# Patient Record
Sex: Female | Born: 1945 | Race: Black or African American | Hispanic: No | Smoking: Never smoker
Health system: Southern US, Community
[De-identification: ages and names within clinical notes are randomized; demographics above are authoritative.]

## PROBLEM LIST (undated history)

## (undated) DIAGNOSIS — I10 Essential (primary) hypertension: Secondary | ICD-10-CM

## (undated) DIAGNOSIS — E119 Type 2 diabetes mellitus without complications: Secondary | ICD-10-CM

## (undated) DIAGNOSIS — E785 Hyperlipidemia, unspecified: Secondary | ICD-10-CM

## (undated) DIAGNOSIS — I2510 Atherosclerotic heart disease of native coronary artery without angina pectoris: Secondary | ICD-10-CM

## (undated) HISTORY — DX: Type 2 diabetes mellitus without complications: E11.9

## (undated) HISTORY — PX: BACK SURGERY: SHX140

## (undated) HISTORY — DX: Essential (primary) hypertension: I10

## (undated) HISTORY — PX: EYE SURGERY: SHX253

## (undated) HISTORY — DX: Atherosclerotic heart disease of native coronary artery without angina pectoris: I25.10

## (undated) HISTORY — DX: Hyperlipidemia, unspecified: E78.5

---

## 2016-05-18 ENCOUNTER — Ambulatory Visit: Payer: Self-pay | Admitting: "Endocrinology

## 2017-07-26 LAB — HEMOGLOBIN A1C: Hemoglobin A1C: 12.4

## 2017-09-19 LAB — BASIC METABOLIC PANEL
BUN: 26 — AB (ref 4–21)
Creatinine: 1.2 — AB (ref <=1.1)

## 2017-09-19 LAB — LIPID PANEL
CHOLESTEROL: 212 — AB (ref 0–200)
HDL: 65 (ref 35–70)
LDL CALC: 130
Triglycerides: 77 (ref 40–160)

## 2017-12-18 ENCOUNTER — Encounter: Payer: Self-pay | Admitting: "Endocrinology

## 2017-12-18 LAB — HEMOGLOBIN A1C: Hemoglobin A1C: 13.1

## 2018-01-23 ENCOUNTER — Ambulatory Visit: Payer: Self-pay | Admitting: "Endocrinology

## 2018-02-03 ENCOUNTER — Ambulatory Visit (INDEPENDENT_AMBULATORY_CARE_PROVIDER_SITE_OTHER): Payer: Medicare Other | Admitting: "Endocrinology

## 2018-02-03 ENCOUNTER — Encounter: Payer: Self-pay | Admitting: "Endocrinology

## 2018-02-03 VITALS — BP 143/78 | HR 66 | Ht 63.0 in | Wt 201.0 lb

## 2018-02-03 DIAGNOSIS — E66812 Obesity, class 2: Secondary | ICD-10-CM | POA: Insufficient documentation

## 2018-02-03 DIAGNOSIS — I10 Essential (primary) hypertension: Secondary | ICD-10-CM

## 2018-02-03 DIAGNOSIS — E782 Mixed hyperlipidemia: Secondary | ICD-10-CM | POA: Insufficient documentation

## 2018-02-03 DIAGNOSIS — Z794 Long term (current) use of insulin: Secondary | ICD-10-CM

## 2018-02-03 DIAGNOSIS — N183 Chronic kidney disease, stage 3 unspecified: Secondary | ICD-10-CM

## 2018-02-03 DIAGNOSIS — E1122 Type 2 diabetes mellitus with diabetic chronic kidney disease: Secondary | ICD-10-CM

## 2018-02-03 DIAGNOSIS — Z6835 Body mass index (BMI) 35.0-35.9, adult: Secondary | ICD-10-CM | POA: Diagnosis not present

## 2018-02-03 NOTE — Progress Notes (Signed)
Endocrinology Consult Note       02/03/2018, 12:53 PM   Subjective:    Patient ID: Jessica Weeks, female    DOB: 11-12-1945.  Jessica Weeks is being seen in consultation for management of currently uncontrolled symptomatic diabetes requested by  Smith Robert, MD.   Past Medical History:  Diagnosis Date  . CAD (coronary artery disease)   . Diabetes mellitus, type II (HCC)   . Hyperlipidemia   . Hypertension    Past Surgical History:  Procedure Laterality Date  . EYE SURGERY     Social History   Socioeconomic History  . Marital status: Divorced    Spouse name: Not on file  . Number of children: Not on file  . Years of education: Not on file  . Highest education level: Not on file  Occupational History  . Not on file  Social Needs  . Financial resource strain: Not on file  . Food insecurity:    Worry: Not on file    Inability: Not on file  . Transportation needs:    Medical: Not on file    Non-medical: Not on file  Tobacco Use  . Smoking status: Never Smoker  . Smokeless tobacco: Never Used  Substance and Sexual Activity  . Alcohol use: Never    Frequency: Never  . Drug use: Never  . Sexual activity: Not on file  Lifestyle  . Physical activity:    Days per week: Not on file    Minutes per session: Not on file  . Stress: Not on file  Relationships  . Social connections:    Talks on phone: Not on file    Gets together: Not on file    Attends religious service: Not on file    Active member of club or organization: Not on file    Attends meetings of clubs or organizations: Not on file    Relationship status: Not on file  Other Topics Concern  . Not on file  Social History Narrative  . Not on file   Outpatient Encounter Medications as of 02/03/2018  Medication Sig  . amitriptyline (ELAVIL) 10 MG tablet at bedtime.  Marland Kitchen atorvastatin (LIPITOR) 20 MG tablet daily.  . brimonidine  (ALPHAGAN) 0.2 % ophthalmic solution 2 (two) times daily.  . carvedilol (COREG) 25 MG tablet 2 (two) times daily with a meal.   . clopidogrel (PLAVIX) 75 MG tablet daily.  Marland Kitchen ezetimibe (ZETIA) 10 MG tablet daily.  . furosemide (LASIX) 40 MG tablet daily.  Marland Kitchen gabapentin (NEURONTIN) 100 MG capsule 200 mg 3 (three) times daily.  . GNP ASPIRIN LOW DOSE 81 MG EC tablet daily.  Marland Kitchen HUMALOG KWIKPEN 100 UNIT/ML KiwkPen Inject 5-11 Units into the skin 3 (three) times daily before meals.  . Insulin Glargine (LANTUS SOLOSTAR) 100 UNIT/ML Solostar Pen Inject 20 Units into the skin at bedtime.  Marland Kitchen NIFEdipine (PROCARDIA-XL/ADALAT CC) 30 MG 24 hr tablet 2 (two) times daily.  . [DISCONTINUED] atropine 1 % ophthalmic solution    No facility-administered encounter medications on file as of 02/03/2018.     ALLERGIES: No Known Allergies  VACCINATION STATUS:  There is  no immunization history on file for this patient.  Diabetes  She presents for her initial diabetic visit. She has type 2 diabetes mellitus. Onset time: She was diagnosed at approximate age of 6 years. Her disease course has been worsening. There are no hypoglycemic associated symptoms. Pertinent negatives for hypoglycemia include no confusion, headaches, pallor or seizures. Associated symptoms include blurred vision, fatigue, polydipsia and polyuria. Pertinent negatives for diabetes include no chest pain and no polyphagia. There are no hypoglycemic complications. Symptoms are worsening. Diabetic complications include heart disease, nephropathy, peripheral neuropathy and retinopathy. Risk factors for coronary artery disease include dyslipidemia, family history, obesity, sedentary lifestyle, post-menopausal and hypertension. Current diabetic treatment includes insulin injections. Her weight is fluctuating minimally. She is following a generally unhealthy diet. When asked about meal planning, she reported none. She has not had a previous visit with a  dietitian. She never participates in exercise. (She did not bring any meter nor logs to review today.  Her most recent A1c was 13.1% on Dec 18, 2017.) An ACE inhibitor/angiotensin II receptor blocker is not being taken. Eye exam is current.  Hyperlipidemia  This is a chronic problem. The problem is uncontrolled. Exacerbating diseases include diabetes and obesity. Pertinent negatives include no chest pain, myalgias or shortness of breath. Current antihyperlipidemic treatment includes statins. Risk factors for coronary artery disease include dyslipidemia, diabetes mellitus, family history, hypertension, obesity, a sedentary lifestyle and post-menopausal.  Hypertension  This is a chronic problem. The current episode started more than 1 year ago. The problem is uncontrolled. Associated symptoms include blurred vision. Pertinent negatives include no chest pain, headaches, palpitations or shortness of breath. Risk factors for coronary artery disease include diabetes mellitus, dyslipidemia, obesity, sedentary lifestyle and post-menopausal state. Past treatments include calcium channel blockers and beta blockers. Hypertensive end-organ damage includes retinopathy.      Review of Systems  Constitutional: Positive for fatigue. Negative for chills, fever and unexpected weight change.  HENT: Negative for trouble swallowing and voice change.   Eyes: Positive for blurred vision. Negative for visual disturbance.  Respiratory: Negative for cough, shortness of breath and wheezing.   Cardiovascular: Negative for chest pain, palpitations and leg swelling.  Gastrointestinal: Negative for diarrhea, nausea and vomiting.  Endocrine: Positive for polydipsia and polyuria. Negative for cold intolerance, heat intolerance and polyphagia.  Musculoskeletal: Negative for arthralgias and myalgias.  Skin: Negative for color change, pallor, rash and wound.  Neurological: Negative for seizures and headaches.   Psychiatric/Behavioral: Negative for confusion and suicidal ideas.    Objective:    BP (!) 143/78   Pulse 66   Ht 5\' 3"  (1.6 m)   Wt 201 lb (91.2 kg)   BMI 35.61 kg/m   Wt Readings from Last 3 Encounters:  02/03/18 201 lb (91.2 kg)     Physical Exam  Constitutional: She is oriented to person, place, and time. She appears well-developed.  HENT:  Head: Normocephalic and atraumatic.  Eyes: EOM are normal.  Neck: Normal range of motion. Neck supple. No tracheal deviation present. No thyromegaly present.  Cardiovascular: Normal rate and regular rhythm.  Pulmonary/Chest: Effort normal and breath sounds normal.  Abdominal: Soft. Bowel sounds are normal. There is no tenderness. There is no guarding.  Musculoskeletal: Normal range of motion. She exhibits no edema.  Neurological: She is alert and oriented to person, place, and time. She has normal reflexes. No cranial nerve deficit. Coordination normal.  Skin: Skin is warm and dry. No rash noted. No erythema. No pallor.  Psychiatric:  She has a normal mood and affect. Judgment normal.    Recent Results (from the past 2160 hour(s))  Hemoglobin A1c     Status: None   Collection Time: 12/18/17 12:00 AM  Result Value Ref Range   Hemoglobin A1C 13.1    Lipid Panel     Component Value Date/Time   CHOL 212 (A) 09/19/2017   TRIG 77 09/19/2017   HDL 65 09/19/2017   LDLCALC 130 09/19/2017     Assessment & Plan:   1. Type 2 diabetes mellitus with stage 3 chronic kidney disease, coronary artery disease, retinopathy with long-term current use of insulin (HCC)  - Jessica Weeks has currently uncontrolled symptomatic type 2 DM since 72 years of age,  with most recent A1c of 13.1 %. Recent labs reviewed.  -her diabetes is complicated by coronary artery disease, stage 3 renal insufficiency, retinopathy, peripheral neuropathy and Jessica Weeks remains at a high risk for more acute and chronic complications which include CAD, CVA, CKD,  retinopathy, and neuropathy. These are all discussed in detail with the patient.  - I have counseled her on diet management and weight loss, by adopting a carbohydrate restricted/protein rich diet.  - Suggestion is made for her to avoid simple carbohydrates  from her diet including Cakes, Sweet Desserts, Ice Cream, Soda (diet and regular), Sweet Tea, Candies, Chips, Cookies, Store Bought Juices, Alcohol in Excess of  1-2 drinks a day, Artificial Sweeteners, and "Sugar-free" Products. This will help patient to have stable blood glucose profile and potentially avoid unintended weight gain.  - I encouraged her to switch to  unprocessed or minimally processed complex starch and increased protein intake (animal or plant source), fruits, and vegetables.  - she is advised to stick to a routine mealtimes to eat 3 meals  a day and avoid unnecessary snacks ( to snack only to correct hypoglycemia).   - she will be scheduled with Norm Salt, RDN, CDE for individualized diabetes education.  - I have approached her with the following individualized plan to manage diabetes and patient agrees:   -Given her current glycemic burden, she will continue to require intensive treatment with basal/bolus insulin at a higher dose.  -However, her compliance is questionable.  Insulin dose adjustment will be performed slowly to avoid inadvertent hypoglycemia.  -I approached her to increase her Lantus to 20 units daily at bedtime , and adjust her Humalog to 5 units 3 times a day with meals  for pre-meal BG readings of 90-150mg /dl, plus patient specific correction dose for unexpected hyperglycemia above 150mg /dl, associated with strict monitoring of glucose 4 times a day-before meals and at bedtime. - Patient is warned not to take insulin without proper monitoring per orders. -Adjustment parameters are given for hypo and hyperglycemia in writing. -Patient is encouraged to call clinic for blood glucose levels less than 70 or  above 300 mg /dl. -She would have benefited from low-dose metformin, however she does not tolerate metformin. -Patient is not a candidate for SGLT2 inhibitors due to CKD.  - she will be considered for incretin therapy as appropriate next visit. - Patient specific target  A1c;  LDL, HDL, Triglycerides, and  Waist Circumference were discussed in detail.  2) BP/HTN: Her blood pressure is not controlled to target.  She is advised to continue current medications including nifedipine  and carvedilol.   3) Lipids/HPL:   Her most recent lipid panel showed LDL of 130 consistent with uncontrolled profile.  She is advised to continue her atorvastatin  20 mg p.o. Nightly.  4)  Weight/Diet: CDE Consult will be initiated , exercise, and detailed carbohydrates information provided.  5) Chronic Care/Health Maintenance:  -she  Is on Statin medications and  is encouraged to continue to follow up with Ophthalmology, Dentist,  Podiatrist at least yearly or according to recommendations, and advised to  stay away from smoking. I have recommended yearly flu vaccine and pneumonia vaccination at least every 5 years; moderate intensity exercise for up to 150 minutes weekly; and  sleep for at least 7 hours a day.  - I advised patient to maintain close follow up with Smith Robert, MD for primary care needs.  - Time spent with the patient: 45 minutes, of which >50% was spent in obtaining information about her symptoms, reviewing her previous labs, evaluations, and treatments, counseling her about her currently uncontrolled, complicated type 2 diabetes, hyperlipidemia, hypertension, and developing developing  plans for long term treatment based on the latest recommendations.  Jessica Weeks participated in the discussions, expressed understanding, and voiced agreement with the above plans.  All questions were answered to her satisfaction. she is encouraged to contact clinic should she have any questions or concerns prior to  her return visit.  Follow up plan: - Return in about 10 days (around 02/13/2018) for follow up with meter and logs- no labs.  Marquis Lunch, MD [facility] [address] [place], [place] [address] Phone: [phone]  Fax: [phone]    02/03/2018, 12:53 PM  This note was partially dictated with voice recognition software. Similar sounding words can be transcribed inadequately or may not  be corrected upon review.

## 2018-02-03 NOTE — Patient Instructions (Signed)

## 2018-02-17 ENCOUNTER — Encounter: Payer: Self-pay | Admitting: "Endocrinology

## 2018-02-17 ENCOUNTER — Ambulatory Visit (INDEPENDENT_AMBULATORY_CARE_PROVIDER_SITE_OTHER): Payer: Medicare Other | Admitting: "Endocrinology

## 2018-02-17 VITALS — BP 131/83 | HR 65 | Ht 63.0 in | Wt 204.0 lb

## 2018-02-17 DIAGNOSIS — E782 Mixed hyperlipidemia: Secondary | ICD-10-CM | POA: Diagnosis not present

## 2018-02-17 DIAGNOSIS — E1165 Type 2 diabetes mellitus with hyperglycemia: Secondary | ICD-10-CM | POA: Insufficient documentation

## 2018-02-17 DIAGNOSIS — I10 Essential (primary) hypertension: Secondary | ICD-10-CM

## 2018-02-17 LAB — GLUCOSE, POCT (MANUAL RESULT ENTRY): POC Glucose: 48 mg/dl — AB (ref 70–99)

## 2018-02-17 NOTE — Progress Notes (Signed)
Endocrinology follow-up note       02/17/2018, 12:46 PM   Subjective:    Patient ID: Jessica Weeks, female    DOB: [birth year].  Jessica Weeks is being seen in follow-up for management of currently uncontrolled symptomatic diabetes requested by  Smith Robert, MD.   Past Medical History:  Diagnosis Date  . CAD (coronary artery disease)   . Diabetes mellitus, type II (HCC)   . Hyperlipidemia   . Hypertension    Past Surgical History:  Procedure Laterality Date  . EYE SURGERY     Social History   Socioeconomic History  . Marital status: Divorced    Spouse name: Not on file  . Number of children: Not on file  . Years of education: Not on file  . Highest education level: Not on file  Occupational History  . Not on file  Social Needs  . Financial resource strain: Not on file  . Food insecurity:    Worry: Not on file    Inability: Not on file  . Transportation needs:    Medical: Not on file    Non-medical: Not on file  Tobacco Use  . Smoking status: Never Smoker  . Smokeless tobacco: Never Used  Substance and Sexual Activity  . Alcohol use: Never    Frequency: Never  . Drug use: Never  . Sexual activity: Not on file  Lifestyle  . Physical activity:    Days per week: Not on file    Minutes per session: Not on file  . Stress: Not on file  Relationships  . Social connections:    Talks on phone: Not on file    Gets together: Not on file    Attends religious service: Not on file    Active member of club or organization: Not on file    Attends meetings of clubs or organizations: Not on file    Relationship status: Not on file  Other Topics Concern  . Not on file  Social History Narrative  . Not on file   Outpatient Encounter Medications as of 02/17/2018  Medication Sig  . amitriptyline (ELAVIL) 10 MG tablet at bedtime.  Marland Kitchen atorvastatin (LIPITOR) 20 MG tablet daily.  . brimonidine  (ALPHAGAN) 0.2 % ophthalmic solution 2 (two) times daily.  . carvedilol (COREG) 25 MG tablet 2 (two) times daily with a meal.   . clopidogrel (PLAVIX) 75 MG tablet daily.  Marland Kitchen ezetimibe (ZETIA) 10 MG tablet daily.  . furosemide (LASIX) 40 MG tablet daily.  Marland Kitchen gabapentin (NEURONTIN) 100 MG capsule 200 mg 3 (three) times daily.  . GNP ASPIRIN LOW DOSE 81 MG EC tablet daily.  Marland Kitchen HUMALOG KWIKPEN 100 UNIT/ML KiwkPen Inject 5 Units into the skin 3 (three) times daily before meals.  . Insulin Glargine (LANTUS SOLOSTAR) 100 UNIT/ML Solostar Pen Inject 20 Units into the skin at bedtime.  Marland Kitchen NIFEdipine (PROCARDIA-XL/ADALAT CC) 30 MG 24 hr tablet 2 (two) times daily.   No facility-administered encounter medications on file as of 02/17/2018.     ALLERGIES: No Known Allergies  VACCINATION STATUS:  There is no immunization history on file for this patient.  Diabetes  She presents for her follow-up diabetic visit. She has type 2 diabetes mellitus. Onset time: She was diagnosed at approximate age of 6 years. Her disease course has been fluctuating. Hypoglycemia symptoms include confusion and headaches. Pertinent negatives for hypoglycemia include no pallor or seizures. Associated symptoms include blurred vision, fatigue, polydipsia and polyuria. Pertinent negatives for diabetes include no chest pain and no polyphagia. There are no hypoglycemic complications. Symptoms are worsening. Diabetic complications include heart disease, nephropathy, peripheral neuropathy and retinopathy. Risk factors for coronary artery disease include dyslipidemia, family history, obesity, sedentary lifestyle, post-menopausal and hypertension. Current diabetic treatment includes insulin injections. Her weight is fluctuating minimally. She is following a generally unhealthy diet. When asked about meal planning, she reported none. She has not had a previous visit with a dietitian. She never participates in exercise. Her home blood glucose trend  is fluctuating dramatically. (She documented still significantly above target blood glucose profile, with some random hypoglycemia when she misses her meals.  Her most recent A1c was 13.1% on Dec 18, 2017.) An ACE inhibitor/angiotensin II receptor blocker is not being taken. Eye exam is current.  Hyperlipidemia  This is a chronic problem. The problem is uncontrolled. Exacerbating diseases include diabetes and obesity. Pertinent negatives include no chest pain, myalgias or shortness of breath. Current antihyperlipidemic treatment includes statins. Risk factors for coronary artery disease include dyslipidemia, diabetes mellitus, family history, hypertension, obesity, a sedentary lifestyle and post-menopausal.  Hypertension  This is a chronic problem. The current episode started more than 1 year ago. The problem is uncontrolled. Associated symptoms include blurred vision and headaches. Pertinent negatives include no chest pain, palpitations or shortness of breath. Risk factors for coronary artery disease include diabetes mellitus, dyslipidemia, obesity, sedentary lifestyle and post-menopausal state. Past treatments include calcium channel blockers and beta blockers. Hypertensive end-organ damage includes retinopathy.      Review of Systems  Constitutional: Positive for fatigue. Negative for chills, fever and unexpected weight change.  HENT: Negative for trouble swallowing and voice change.   Eyes: Positive for blurred vision. Negative for visual disturbance.  Respiratory: Negative for cough, shortness of breath and wheezing.   Cardiovascular: Negative for chest pain, palpitations and leg swelling.  Gastrointestinal: Negative for diarrhea, nausea and vomiting.  Endocrine: Positive for polydipsia and polyuria. Negative for cold intolerance, heat intolerance and polyphagia.  Musculoskeletal: Negative for arthralgias and myalgias.  Skin: Negative for color change, pallor, rash and wound.  Neurological:  Positive for headaches. Negative for seizures.  Psychiatric/Behavioral: Positive for confusion. Negative for suicidal ideas.    Objective:    BP 131/83   Pulse 65   Ht 5\' 3"  (1.6 m)   Wt 204 lb (92.5 kg)   BMI 36.14 kg/m   Wt Readings from Last 3 Encounters:  02/17/18 204 lb (92.5 kg)  02/03/18 201 lb (91.2 kg)     Physical Exam  Constitutional: She is oriented to person, place, and time. She appears well-developed.  HENT:  Head: Normocephalic and atraumatic.  Eyes: EOM are normal.  Neck: Normal range of motion. Neck supple. No tracheal deviation present. No thyromegaly present.  Cardiovascular: Normal rate.  Pulmonary/Chest: Effort normal.  Abdominal: There is no tenderness. There is no guarding.  Musculoskeletal: Normal range of motion. She exhibits no edema.  Neurological: She is alert and oriented to person, place, and time. She has normal reflexes. No cranial nerve deficit. Coordination normal.  Skin: Skin is warm and dry. No rash noted. No erythema. No pallor.  Psychiatric:  She has a normal mood and affect. Judgment normal.  Patient with significant cognitive deficit.     Recent Results (from the past 2160 hour(s))  Hemoglobin A1c     Status: None   Collection Time: 12/18/17 12:00 AM  Result Value Ref Range   Hemoglobin A1C 13.1   POCT glucose (manual entry)     Status: Abnormal   Collection Time: 02/17/18 10:12 AM  Result Value Ref Range   POC Glucose 48 (A) 70 - 99 mg/dl    Comment: fasting - Pt was given Orange Juice   Lipid Panel     Component Value Date/Time   CHOL 212 (A) 09/19/2017   TRIG 77 09/19/2017   HDL 65 09/19/2017   LDLCALC 130 09/19/2017     Assessment & Plan:   1. Type 2 diabetes mellitus with stage 3 chronic kidney disease, coronary artery disease, retinopathy with long-term current use of insulin (HCC)  - Jessica Weeks has currently uncontrolled symptomatic type 2 DM since 72 years of age. -She presents with significantly  fluctuating blood glucose profile including hypoglycemia.  She made significant errors of insulin dosing, took Humalog even when she is not eating.  - Her most recent A1c was 13.1 %. Recent labs reviewed.  -her diabetes is complicated by coronary artery disease, stage 3 renal insufficiency, retinopathy, peripheral neuropathy and Jessica Weeks remains at a high risk for more acute and chronic complications which include CAD, CVA, CKD, retinopathy, and neuropathy. These are all discussed in detail with the patient.  - I have counseled her on diet management and weight loss, by adopting a carbohydrate restricted/protein rich diet.  -  Suggestion is made for her to avoid simple carbohydrates  from her diet including Cakes, Sweet Desserts / Pastries, Ice Cream, Soda (diet and regular), Sweet Tea, Candies, Chips, Cookies, Store Bought Juices, Alcohol in Excess of  1-2 drinks a day, Artificial Sweeteners, and "Sugar-free" Products. This will help patient to have stable blood glucose profile and potentially avoid unintended weight gain.   - I encouraged her to switch to  unprocessed or minimally processed complex starch and increased protein intake (animal or plant source), fruits, and vegetables.  - she is advised to stick to a routine mealtimes to eat 3 meals  a day and avoid unnecessary snacks ( to snack only to correct hypoglycemia).   - she has been scheduled with Jessica Weeks, RDN, CDE for individualized diabetes education.  - I have approached her with the following individualized plan to manage diabetes and patient agrees:   -Given her current glycemic burden, she would continue to require intensive treatment with basal/bolus insulin at a higher dose.  -However, her compliance and cognitive ability is questionable .  Insulin dose adjustment will be performed slowly to avoid inadvertent hypoglycemia.  -In fact, she is unreliable for basal/bolus insulin.  After she uses her current supplies of  Lantus and Humalog, she will be switched to premixed insulin to take twice a day. -In the meantime, I reapproached her to stay on Lantus 20 units daily at bedtime , and adjust her Humalog to 5 units 3 times a day with meals  for pre-meal BG readings of greater than or equal to 90mg /dl,  associated with strict monitoring of glucose 4 times a day-before meals and at bedtime. - Patient is warned not to take insulin without proper monitoring per orders. -Adjustment parameters are given for hypo and hyperglycemia in writing. -Patient is encouraged to call clinic for blood glucose  levels less than 70 or above 300 mg /dl. -She would have benefited from low-dose metformin, however she does not tolerate metformin. -Patient is not a candidate for SGLT2 inhibitors due to CKD.  - she will be considered for incretin therapy as appropriate next visit. - Patient specific target  A1c;  LDL, HDL, Triglycerides, and  Waist Circumference were discussed in detail.  2) BP/HTN: Her blood pressure is controlled to target.    She is advised to continue current medications including nifedipine  and carvedilol.   3) Lipids/HPL:   Her most recent lipid panel showed LDL of 130 consistent with uncontrolled profile.  She is advised to continue her atorvastatin 20 mg p.o. Nightly.  4)  Weight/Diet: CDE Consult will be initiated , exercise, and detailed carbohydrates information provided.  5) Chronic Care/Health Maintenance:  -she  Is on Statin medications and  is encouraged to continue to follow up with Ophthalmology, Dentist,  Podiatrist at least yearly or according to recommendations, and advised to  stay away from smoking. I have recommended yearly flu vaccine and pneumonia vaccination at least every 5 years; moderate intensity exercise for up to 150 minutes weekly; and  sleep for at least 7 hours a day.  - I advised patient to maintain close follow up with Smith Robert, MD for primary care needs.  - Time spent with the  patient: 25 min, of which >50% was spent in reviewing her blood glucose logs , discussing her hypo- and hyper-glycemic episodes, reviewing her current and  previous labs and insulin doses and developing a plan to avoid hypo- and hyper-glycemia. Please refer to Patient Instructions for Blood Glucose Monitoring and Insulin/Medications Dosing Guide"  in media tab for additional information. Jessica Weeks participated in the discussions, expressed understanding, and voiced agreement with the above plans.  All questions were answered to her satisfaction. she is encouraged to contact clinic should she have any questions or concerns prior to her return visit.   Follow up plan: - Return in about 1 month (around 03/17/2018) for follow up with meter and logs- no labs.  Jessica Lunch, MD [facility] [address] [place], [place] [address] Phone: [phone]  Fax: [phone]    02/17/2018, 12:46 PM  This note was partially dictated with voice recognition software. Similar sounding words can be transcribed inadequately or may not  be corrected upon review.

## 2018-02-17 NOTE — Patient Instructions (Signed)

## 2018-03-17 ENCOUNTER — Encounter: Payer: Self-pay | Admitting: Nutrition

## 2018-03-17 ENCOUNTER — Encounter: Payer: Medicare Other | Attending: Emergency Medicine | Admitting: Nutrition

## 2018-03-17 ENCOUNTER — Encounter: Payer: Self-pay | Admitting: "Endocrinology

## 2018-03-17 ENCOUNTER — Ambulatory Visit (INDEPENDENT_AMBULATORY_CARE_PROVIDER_SITE_OTHER): Payer: Medicare Other | Admitting: "Endocrinology

## 2018-03-17 VITALS — Ht 63.0 in | Wt 203.0 lb

## 2018-03-17 VITALS — BP 132/81 | HR 62 | Ht 63.0 in | Wt 203.2 lb

## 2018-03-17 DIAGNOSIS — E1165 Type 2 diabetes mellitus with hyperglycemia: Secondary | ICD-10-CM

## 2018-03-17 DIAGNOSIS — I10 Essential (primary) hypertension: Secondary | ICD-10-CM

## 2018-03-17 DIAGNOSIS — Z794 Long term (current) use of insulin: Secondary | ICD-10-CM | POA: Diagnosis not present

## 2018-03-17 DIAGNOSIS — Z6835 Body mass index (BMI) 35.0-35.9, adult: Secondary | ICD-10-CM | POA: Diagnosis not present

## 2018-03-17 DIAGNOSIS — Z713 Dietary counseling and surveillance: Secondary | ICD-10-CM | POA: Diagnosis present

## 2018-03-17 DIAGNOSIS — N183 Chronic kidney disease, stage 3 (moderate): Secondary | ICD-10-CM | POA: Diagnosis not present

## 2018-03-17 DIAGNOSIS — E782 Mixed hyperlipidemia: Secondary | ICD-10-CM

## 2018-03-17 DIAGNOSIS — E1122 Type 2 diabetes mellitus with diabetic chronic kidney disease: Secondary | ICD-10-CM | POA: Diagnosis not present

## 2018-03-17 DIAGNOSIS — E1142 Type 2 diabetes mellitus with diabetic polyneuropathy: Secondary | ICD-10-CM

## 2018-03-17 DIAGNOSIS — E669 Obesity, unspecified: Secondary | ICD-10-CM | POA: Diagnosis not present

## 2018-03-17 NOTE — Patient Instructions (Addendum)
Goals 1. Eat three meals per day 2. Follow The Plate MEthod 3. Do not skip meals 4. Take 30 units of Lantus  Before bed daily. Take 10 units of Humalog before each meal Test blood sugars 4 times per day  Cut out sweets and snacks between meals Drink only water Inject insulin in abdomen area and not arms or legs for better absorption.

## 2018-03-17 NOTE — Progress Notes (Signed)
  Medical Nutrition Therapy:  Appt start time: 0930 end time:  1000.   Assessment:  Primary concerns today: DIabetes Type 2, Obesity. Walk in from Dr. Fransico Him. Will do safety questions next visit.   Lab Results  Component Value Date   HGBA1C 13.1 12/18/2017  Saw Dr. Fransico Him today. LIves by herself. Currently on 20 units of Lantus and 5 units of Humalog with meals. Reports not eating some meals and not taking insulin. Craves sweets. BS log brought in shows FBS 176-321 in am. Before lunch 200-300's before dinner 200-300's and bedtime 100-200's. Eats usually 3 meals per day but sometimes skips a meal. Testing blood sugar 4 times per day. Gets her lunch and dinner from meals on wheels. Admits to eating sweets at home. Doesn't drink much water. Not able to exercise outside the home. She doesn't cook much anymore  Willing to work on improving her diet to improve her blood sugars..  CMP Latest Ref Rng & Units 09/19/2017  BUN 4 - 21 26(A)  Creatinine 0.5 - 1.1 1.2(A)   Lipid Panel     Component Value Date/Time   CHOL 212 (A) 09/19/2017   TRIG 77 09/19/2017   HDL 65 09/19/2017   LDLCALC 130 09/19/2017       Preferred Learning Style:   Auditory  Visual  Hands on  No preference indicated   Learning Readiness:   Contemplating   MEDICATIONS: none   DIETARY INTAKE:   B0  Eggs and oatmeal usually L) Meals on Wheels D) Meals on wheels misc sweets and snacks throughout the day.  Usual physical activity: ADL  Estimated energy needs: 1500  calories 170  g carbohydrates 112 g protein 42 g fat  Progress Towards Goal(s):  In progress.   Nutritional Diagnosis:  NB-1.1 Food and nutrition-related knowledge deficit As related to Diabetes.  As evidenced by A1C 13%.    Intervention:  Nutrition and Diabetes education provided on My Plate, CHO counting, meal planning, portion sizes, timing of meals, avoiding snacks between meals unless having a low blood sugar, target ranges for A1C and  blood sugars, signs/symptoms and treatment of hyper/hypoglycemia, monitoring blood sugars, taking medications as prescribed, benefits of exercising 30 minutes per day and prevention of complications of DM. Marland Kitchen Goals 1. Eat three meals per day 2. Follow The Plate MEthod 3. Do not skip meals 4. Take 30 units of Lantus  Before bed daily. Take 10 units of Humalog before each meal Test blood sugars 4 times per day  Cut out sweets and snacks between meals Drink only water Inject insulin in abdomen area and not arms or legs for better absorption.   Teaching Method Utilized:  Visual Auditory Hands on  Handouts given during visit include:  The Plate Method   Meal Plan Card    Barriers to learning/adherence to lifestyle change: age  Demonstrated degree of understanding via:  Teach Back   Monitoring/Evaluation:  Dietary intake, exercise, meal planning, and body weight in 1 month(s).

## 2018-03-17 NOTE — Patient Instructions (Signed)

## 2018-03-17 NOTE — Progress Notes (Signed)
Endocrinology follow-up note       03/17/2018, 1:06 PM   Subjective:    Patient ID: Jessica Weeks, female    DOB: 02-Oct-1945.  Jessica Weeks is being seen in follow-up for management of currently uncontrolled symptomatic diabetes requested by  Smith Robert, MD.   Past Medical History:  Diagnosis Date  . CAD (coronary artery disease)   . Diabetes mellitus, type II (HCC)   . Hyperlipidemia   . Hypertension    Past Surgical History:  Procedure Laterality Date  . EYE SURGERY     Social History   Socioeconomic History  . Marital status: Divorced    Spouse name: Not on file  . Number of children: Not on file  . Years of education: Not on file  . Highest education level: Not on file  Occupational History  . Not on file  Social Needs  . Financial resource strain: Not on file  . Food insecurity:    Worry: Not on file    Inability: Not on file  . Transportation needs:    Medical: Not on file    Non-medical: Not on file  Tobacco Use  . Smoking status: Never Smoker  . Smokeless tobacco: Never Used  Substance and Sexual Activity  . Alcohol use: Never    Frequency: Never  . Drug use: Never  . Sexual activity: Not on file  Lifestyle  . Physical activity:    Days per week: Not on file    Minutes per session: Not on file  . Stress: Not on file  Relationships  . Social connections:    Talks on phone: Not on file    Gets together: Not on file    Attends religious service: Not on file    Active member of club or organization: Not on file    Attends meetings of clubs or organizations: Not on file    Relationship status: Not on file  Other Topics Concern  . Not on file  Social History Narrative  . Not on file   Outpatient Encounter Medications as of 03/17/2018  Medication Sig  . amitriptyline (ELAVIL) 10 MG tablet at bedtime.  Marland Kitchen atorvastatin (LIPITOR) 20 MG tablet daily.  . brimonidine  (ALPHAGAN) 0.2 % ophthalmic solution 2 (two) times daily.  . carvedilol (COREG) 25 MG tablet 2 (two) times daily with a meal.   . clopidogrel (PLAVIX) 75 MG tablet daily.  Marland Kitchen ezetimibe (ZETIA) 10 MG tablet daily.  Marland Kitchen gabapentin (NEURONTIN) 100 MG capsule 200 mg 3 (three) times daily.  . GNP ASPIRIN LOW DOSE 81 MG EC tablet daily.  Marland Kitchen HUMALOG KWIKPEN 100 UNIT/ML KiwkPen Inject 10 Units into the skin 3 (three) times daily before meals.  Marland Kitchen NIFEdipine (PROCARDIA-XL/ADALAT CC) 30 MG 24 hr tablet 2 (two) times daily.  . furosemide (LASIX) 40 MG tablet daily.  . Insulin Glargine (LANTUS SOLOSTAR) 100 UNIT/ML Solostar Pen Inject 30 Units into the skin at bedtime.   No facility-administered encounter medications on file as of 03/17/2018.     ALLERGIES: No Known Allergies  VACCINATION STATUS:  There is no immunization history on file for this patient.  Diabetes  She presents for her follow-up diabetic visit. She has type 2 diabetes mellitus. Onset time: She was diagnosed at approximate age of 6 years. Her disease course has been fluctuating. Hypoglycemia symptoms include confusion and headaches. Pertinent negatives for hypoglycemia include no pallor or seizures. Associated symptoms include blurred vision, fatigue, polydipsia and polyuria. Pertinent negatives for diabetes include no chest pain and no polyphagia. There are no hypoglycemic complications. Symptoms are worsening. Diabetic complications include heart disease, nephropathy, peripheral neuropathy and retinopathy. Weeks factors for coronary artery disease include dyslipidemia, family history, obesity, sedentary lifestyle, post-menopausal and hypertension. Current diabetic treatment includes insulin injections. Her weight is fluctuating minimally. She is following a generally unhealthy diet. When asked about meal planning, she reported none. She has not had a previous visit with a dietitian. She never participates in exercise. Her home blood glucose  trend is increasing steadily. Her breakfast blood glucose range is generally >200 mg/dl. Her lunch blood glucose range is generally >200 mg/dl. Her dinner blood glucose range is generally >200 mg/dl. Her bedtime blood glucose range is generally >200 mg/dl. Her overall blood glucose range is >200 mg/dl. (She documented still significantly above target blood glucose profile, with some random hypoglycemia when she misses her meals.  Her most recent A1c was 13.1% on Dec 18, 2017.) An ACE inhibitor/angiotensin II receptor blocker is not being taken. Eye exam is current.  Hyperlipidemia  This is a chronic problem. The problem is uncontrolled. Exacerbating diseases include diabetes and obesity. Pertinent negatives include no chest pain, myalgias or shortness of breath. Current antihyperlipidemic treatment includes statins. Weeks factors for coronary artery disease include dyslipidemia, diabetes mellitus, family history, hypertension, obesity, a sedentary lifestyle and post-menopausal.  Hypertension  This is a chronic problem. The current episode started more than 1 year ago. The problem is uncontrolled. Associated symptoms include blurred vision and headaches. Pertinent negatives include no chest pain, palpitations or shortness of breath. Weeks factors for coronary artery disease include diabetes mellitus, dyslipidemia, obesity, sedentary lifestyle and post-menopausal state. Past treatments include calcium channel blockers and beta blockers. Hypertensive end-organ damage includes retinopathy.      Review of Systems  Constitutional: Positive for fatigue. Negative for chills, fever and unexpected weight change.  HENT: Negative for trouble swallowing and voice change.   Eyes: Positive for blurred vision. Negative for visual disturbance.  Respiratory: Negative for cough, shortness of breath and wheezing.   Cardiovascular: Negative for chest pain, palpitations and leg swelling.  Gastrointestinal: Negative for  diarrhea, nausea and vomiting.  Endocrine: Positive for polydipsia and polyuria. Negative for cold intolerance, heat intolerance and polyphagia.  Musculoskeletal: Negative for arthralgias and myalgias.  Skin: Negative for color change, pallor, rash and wound.  Neurological: Positive for headaches. Negative for seizures.  Psychiatric/Behavioral: Positive for confusion. Negative for suicidal ideas.    Objective:    BP 132/81 (BP Location: Left Arm, Patient Position: Sitting)   Pulse 62   Ht 5\' 3"  (1.6 m)   Wt 203 lb 3.2 oz (92.2 kg)   SpO2 99%   BMI 36.00 kg/m   Wt Readings from Last 3 Encounters:  03/17/18 203 lb 3.2 oz (92.2 kg)  02/17/18 204 lb (92.5 kg)  02/03/18 201 lb (91.2 kg)     Physical Exam  Constitutional: She is oriented to person, place, and time. She appears well-developed.  HENT:  Head: Normocephalic and atraumatic.  Eyes: EOM are normal.  Neck: Normal range of motion. Neck supple. No tracheal deviation present. No thyromegaly present.  Cardiovascular: Normal rate.  Pulmonary/Chest: Effort normal.  Abdominal: There is no tenderness. There is no guarding.  Musculoskeletal: Normal range of motion. She exhibits no edema.  Neurological: She is alert and oriented to person, place, and time. She has normal reflexes. No cranial nerve deficit. Coordination normal.  Skin: Skin is warm and dry. No rash noted. No erythema. No pallor.  Psychiatric: She has a normal mood and affect. Judgment normal.  Patient with significant cognitive deficit.     Recent Results (from the past 2160 hour(s))  Hemoglobin A1c     Status: None   Collection Time: 12/18/17 12:00 AM  Result Value Ref Range   Hemoglobin A1C 13.1   POCT glucose (manual entry)     Status: Abnormal   Collection Time: 02/17/18 10:12 AM  Result Value Ref Range   POC Glucose 48 (A) 70 - 99 mg/dl    Comment: fasting - Pt was given Orange Juice   Lipid Panel     Component Value Date/Time   CHOL 212 (A)  09/19/2017   TRIG 77 09/19/2017   HDL 65 09/19/2017   LDLCALC 130 09/19/2017     Assessment & Plan:   1. Type 2 diabetes mellitus with stage 3 chronic kidney disease, coronary artery disease, retinopathy with long-term current use of insulin (HCC)  - Jessica Weeks has currently uncontrolled symptomatic type 2 DM since 72 years of age. -She presents with persistent, severe hyperglycemia despite basal/bolus insulin.    - Her most recent A1c was 13.1 %. Recent labs reviewed.  -her diabetes is complicated by coronary artery disease, stage 3 renal insufficiency, retinopathy, peripheral neuropathy and Jessica Weeks for more acute and chronic complications which include CAD, CVA, CKD, retinopathy, and neuropathy. These are all discussed in detail with the patient.  - I have counseled her on diet management and weight loss, by adopting a carbohydrate restricted/protein rich diet.  -  Suggestion is made for her to avoid simple carbohydrates  from her diet including Cakes, Sweet Desserts / Pastries, Ice Cream, Soda (diet and regular), Sweet Tea, Candies, Chips, Cookies, Store Bought Juices, Alcohol in Excess of  1-2 drinks a day, Artificial Sweeteners, and "Sugar-free" Products. This will help patient to have stable blood glucose profile and potentially avoid unintended weight gain.   - I encouraged her to switch to  unprocessed or minimally processed complex starch and increased protein intake (animal or plant source), fruits, and vegetables.  - she is advised to stick to a routine mealtimes to eat 3 meals  a day and avoid unnecessary snacks ( to snack only to correct hypoglycemia).   - she has been scheduled with Norm Salt, RDN, CDE for individualized diabetes education.  - I have approached her with the following individualized plan to manage diabetes and patient agrees:   -Given her current glycemic burden, she is approached for intensive treatment with  basal/bolus insulin at a higher dose.  -However, her compliance and cognitive ability is questionable .  Insulin dose adjustment will be performed slowly to avoid inadvertent hypoglycemia.  - After she uses her current supplies of Lantus and Humalog, she will be switched to premixed insulin to take twice a day. -In the meantime, I advised her to increase her Lantus to 30 units daily at bedtime , and increase  Humalog to 10 units 3 times a day with meals  for pre-meal BG readings of greater than or equal to 90mg /dl,  associated with strict monitoring  of glucose 4 times a day-before meals and at bedtime. - Patient is warned not to take insulin without proper monitoring per orders. -Adjustment parameters are given for hypo and hyperglycemia in writing. -Patient is encouraged to call clinic for blood glucose levels less than 70 or above 300 mg /dl. -She would have benefited from low-dose metformin, however she does not tolerate metformin. -Patient is not a candidate for SGLT2 inhibitors due to CKD.  - she will be considered for incretin therapy as appropriate next visit. - Patient specific target  A1c;  LDL, HDL, Triglycerides, and  Waist Circumference were discussed in detail.  2) BP/HTN: Her blood pressure is controlled to target.    She is advised to continue current medications including nifedipine  and carvedilol.   3) Lipids/HPL:   Her most recent lipid panel showed LDL of 130 consistent with uncontrolled profile.  She is advised to continue her atorvastatin 20 mg p.o. nightly.     4)  Weight/Diet: CDE Consult is been initiated , exercise, and detailed carbohydrates information provided.  5) Chronic Care/Health Maintenance:  -she  Is on Statin medications and  is encouraged to continue to follow up with Ophthalmology, Dentist,  Podiatrist at least yearly or according to recommendations, and advised to  stay away from smoking. I have recommended yearly flu vaccine and pneumonia vaccination at  least every 5 years; moderate intensity exercise for up to 150 minutes weekly; and  sleep for at least 7 hours a day.  - I advised patient to maintain close follow up with Smith Robert, MD for primary care needs.  - Time spent with the patient: 25 min, of which >50% was spent in reviewing her blood glucose logs , discussing her hypo- and hyper-glycemic episodes, reviewing her current and  previous labs and insulin doses and developing a plan to avoid hypo- and hyper-glycemia. Please refer to Patient Instructions for Blood Glucose Monitoring and Insulin/Medications Dosing Guide"  in media tab for additional information. Jessica Weeks participated in the discussions, expressed understanding, and voiced agreement with the above plans.  All questions were answered to her satisfaction. she is encouraged to contact clinic should she have any questions or concerns prior to her return visit.   Follow up plan: - Return in about 2 weeks (around 03/31/2018) for follow up with pre-visit labs, meter, and logs.  Marquis Lunch, MD [facility] [address] [place], [place] [address] Phone: [phone]  Fax: [phone]    03/17/2018, 1:06 PM  This note was partially dictated with voice recognition software. Similar sounding words can be transcribed inadequately or may not  be corrected upon review.

## 2018-03-31 ENCOUNTER — Ambulatory Visit: Payer: Medicare Other | Admitting: Nutrition

## 2018-03-31 ENCOUNTER — Ambulatory Visit: Payer: Medicare Other | Admitting: "Endocrinology

## 2018-07-21 LAB — BASIC METABOLIC PANEL
BUN: 42 — AB (ref 4–21)
Creatinine: 1.5 — AB (ref 0.5–1.1)

## 2018-07-21 LAB — HEMOGLOBIN A1C: Hemoglobin A1C: 12.2

## 2018-08-07 ENCOUNTER — Encounter: Payer: Self-pay | Admitting: "Endocrinology

## 2018-08-07 ENCOUNTER — Emergency Department (HOSPITAL_COMMUNITY)
Admission: EM | Admit: 2018-08-07 | Discharge: 2018-08-07 | Disposition: A | Discharge status: Home / Self Care | Payer: Medicare Other | Attending: Emergency Medicine | Admitting: Emergency Medicine

## 2018-08-07 ENCOUNTER — Ambulatory Visit (INDEPENDENT_AMBULATORY_CARE_PROVIDER_SITE_OTHER): Payer: Medicare Other | Admitting: "Endocrinology

## 2018-08-07 ENCOUNTER — Encounter (HOSPITAL_COMMUNITY): Payer: Self-pay | Admitting: Emergency Medicine

## 2018-08-07 ENCOUNTER — Other Ambulatory Visit: Payer: Self-pay

## 2018-08-07 ENCOUNTER — Emergency Department (HOSPITAL_COMMUNITY): Payer: Medicare Other

## 2018-08-07 VITALS — BP 202/95 | HR 66 | Ht 63.0 in | Wt 202.0 lb

## 2018-08-07 DIAGNOSIS — Z7902 Long term (current) use of antithrombotics/antiplatelets: Secondary | ICD-10-CM | POA: Diagnosis not present

## 2018-08-07 DIAGNOSIS — E1165 Type 2 diabetes mellitus with hyperglycemia: Secondary | ICD-10-CM

## 2018-08-07 DIAGNOSIS — N390 Urinary tract infection, site not specified: Secondary | ICD-10-CM | POA: Diagnosis not present

## 2018-08-07 DIAGNOSIS — I10 Essential (primary) hypertension: Secondary | ICD-10-CM | POA: Diagnosis present

## 2018-08-07 DIAGNOSIS — Z794 Long term (current) use of insulin: Secondary | ICD-10-CM | POA: Diagnosis not present

## 2018-08-07 DIAGNOSIS — E119 Type 2 diabetes mellitus without complications: Secondary | ICD-10-CM | POA: Insufficient documentation

## 2018-08-07 DIAGNOSIS — E782 Mixed hyperlipidemia: Secondary | ICD-10-CM | POA: Diagnosis not present

## 2018-08-07 DIAGNOSIS — Z79899 Other long term (current) drug therapy: Secondary | ICD-10-CM | POA: Diagnosis not present

## 2018-08-07 DIAGNOSIS — R42 Dizziness and giddiness: Secondary | ICD-10-CM | POA: Insufficient documentation

## 2018-08-07 DIAGNOSIS — I2510 Atherosclerotic heart disease of native coronary artery without angina pectoris: Secondary | ICD-10-CM | POA: Diagnosis not present

## 2018-08-07 LAB — URINALYSIS, ROUTINE W REFLEX MICROSCOPIC
Bilirubin Urine: NEGATIVE
Glucose, UA: NEGATIVE mg/dL
Ketones, ur: NEGATIVE mg/dL
Nitrite: NEGATIVE
Protein, ur: 100 mg/dL — AB
Specific Gravity, Urine: 1.015 (ref 1.005–1.030)
pH: 5 (ref 5.0–8.0)

## 2018-08-07 LAB — CBC WITH DIFFERENTIAL/PLATELET
Abs Immature Granulocytes: 0.01 10*3/uL (ref 0.00–0.07)
Basophils Absolute: 0 10*3/uL (ref 0.0–0.1)
Basophils Relative: 1 %
Eosinophils Absolute: 0.4 10*3/uL (ref 0.0–0.5)
Eosinophils Relative: 8 %
HCT: 31.4 % — ABNORMAL LOW (ref 36.0–46.0)
Hemoglobin: 9.9 g/dL — ABNORMAL LOW (ref 12.0–15.0)
Immature Granulocytes: 0 %
Lymphocytes Relative: 35 %
Lymphs Abs: 1.7 10*3/uL (ref 0.7–4.0)
MCH: 28 pg (ref 26.0–34.0)
MCHC: 31.5 g/dL (ref 30.0–36.0)
MCV: 88.7 fL (ref 80.0–100.0)
MONOS PCT: 9 %
Monocytes Absolute: 0.4 10*3/uL (ref 0.1–1.0)
Neutro Abs: 2.3 10*3/uL (ref 1.7–7.7)
Neutrophils Relative %: 47 %
Platelets: 223 10*3/uL (ref 150–400)
RBC: 3.54 MIL/uL — ABNORMAL LOW (ref 3.87–5.11)
RDW: 13 % (ref 11.5–15.5)
WBC: 4.8 10*3/uL (ref 4.0–10.5)
nRBC: 0 % (ref 0.0–0.2)

## 2018-08-07 LAB — BASIC METABOLIC PANEL
Anion gap: 7 (ref 5–15)
BUN: 30 mg/dL — ABNORMAL HIGH (ref 8–23)
CALCIUM: 9.1 mg/dL (ref 8.9–10.3)
CO2: 24 mmol/L (ref 22–32)
Chloride: 108 mmol/L (ref 98–111)
Creatinine, Ser: 1.12 mg/dL — ABNORMAL HIGH (ref 0.44–1.00)
GFR calc Af Amer: 58 mL/min — ABNORMAL LOW (ref >60)
GFR, EST NON AFRICAN AMERICAN: 50 mL/min — AB (ref >60)
Glucose, Bld: 149 mg/dL — ABNORMAL HIGH (ref 70–99)
Potassium: 4.5 mmol/L (ref 3.5–5.1)
Sodium: 139 mmol/L (ref 135–145)

## 2018-08-07 LAB — TROPONIN I: Troponin I: <0.03 ng/mL (ref <0.03)

## 2018-08-07 MED ORDER — CEPHALEXIN 500 MG PO CAPS: 500.0000 mg | ORAL_CAPSULE | Freq: Four times a day (QID) | ORAL | 0 refills | Status: DC

## 2018-08-07 NOTE — ED Triage Notes (Signed)
Patient sent by her endocrinologist due to hypertension; recorded as 197/80 and 202/95. Patient states she did have a spell of dizziness yesterday but has no pains.

## 2018-08-07 NOTE — Discharge Instructions (Signed)
Take your usual prescriptions as previously directed.  Take your blood pressure only once per day, a few times per week, either in the morning approximately 1 hour after you take your medicine(s) or in the evening before you go to bed.  Always sit quietly for at least 15 minutes before taking your blood pressure.  Keep a diary of your blood pressures to show your doctor at your follow up office visit.  Call your regular medical doctor today to schedule a follow up appointment in the next 2 days.  Return to the Emergency Department immediately sooner if worsening.

## 2018-08-07 NOTE — Patient Instructions (Signed)

## 2018-08-07 NOTE — ED Notes (Signed)
Pt to xray

## 2018-08-07 NOTE — Progress Notes (Signed)
Endocrinology follow-up note       08/07/2018, 2:36 PM   Subjective:    Patient ID: Jessica Weeks, female    DOB: [birth year].  Jessica Weeks is being seen in follow-up for management of currently uncontrolled symptomatic type 2 diabetes, hypertension, hyperlipidemia.  PMD:  Smith Robert, MD.   Past Medical History:  Diagnosis Date  . CAD (coronary artery disease)   . Diabetes mellitus, type II (HCC)   . Hyperlipidemia   . Hypertension    Past Surgical History:  Procedure Laterality Date  . EYE SURGERY     Social History   Socioeconomic History  . Marital status: Divorced    Spouse name: Not on file  . Number of children: Not on file  . Years of education: Not on file  . Highest education level: Not on file  Occupational History  . Not on file  Social Needs  . Financial resource strain: Not on file  . Food insecurity:    Worry: Not on file    Inability: Not on file  . Transportation needs:    Medical: Not on file    Non-medical: Not on file  Tobacco Use  . Smoking status: Never Smoker  . Smokeless tobacco: Never Used  Substance and Sexual Activity  . Alcohol use: Never    Frequency: Never  . Drug use: Never  . Sexual activity: Not on file  Lifestyle  . Physical activity:    Days per week: Not on file    Minutes per session: Not on file  . Stress: Not on file  Relationships  . Social connections:    Talks on phone: Not on file    Gets together: Not on file    Attends religious service: Not on file    Active member of club or organization: Not on file    Attends meetings of clubs or organizations: Not on file    Relationship status: Not on file  Other Topics Concern  . Not on file  Social History Narrative  . Not on file   Outpatient Encounter Medications as of 08/07/2018  Medication Sig  . amitriptyline (ELAVIL) 10 MG tablet at bedtime.  Marland Kitchen atorvastatin (LIPITOR) 20 MG  tablet daily.  . brimonidine (ALPHAGAN) 0.2 % ophthalmic solution 2 (two) times daily.  . carvedilol (COREG) 25 MG tablet 2 (two) times daily with a meal.   . clopidogrel (PLAVIX) 75 MG tablet daily.  Marland Kitchen ezetimibe (ZETIA) 10 MG tablet daily.  . furosemide (LASIX) 40 MG tablet daily.  Marland Kitchen gabapentin (NEURONTIN) 100 MG capsule 200 mg 3 (three) times daily.  . GNP ASPIRIN LOW DOSE 81 MG EC tablet daily.  Marland Kitchen HUMALOG KWIKPEN 100 UNIT/ML KiwkPen Inject 10 Units into the skin 3 (three) times daily before meals.  . Insulin Glargine (LANTUS SOLOSTAR) 100 UNIT/ML Solostar Pen Inject 30 Units into the skin at bedtime.  Marland Kitchen NIFEdipine (PROCARDIA-XL/ADALAT CC) 30 MG 24 hr tablet 2 (two) times daily.   No facility-administered encounter medications on file as of 08/07/2018.     ALLERGIES: Allergies  Allergen Reactions  . Lisinopril Itching  . Novolog [Insulin Aspart] Hives  VACCINATION STATUS:  There is no immunization history on file for this patient.  Diabetes  She presents for her follow-up diabetic visit. She has type 2 diabetes mellitus. Onset time: She was diagnosed at approximate age of 6 years. Her disease course has been worsening. Hypoglycemia symptoms include headaches. Pertinent negatives for hypoglycemia include no confusion, pallor or seizures. Associated symptoms include blurred vision, fatigue, polydipsia and polyuria. Pertinent negatives for diabetes include no chest pain and no polyphagia. There are no hypoglycemic complications. Symptoms are worsening. Diabetic complications include heart disease, nephropathy, peripheral neuropathy and retinopathy. Risk factors for coronary artery disease include dyslipidemia, family history, obesity, sedentary lifestyle, post-menopausal and hypertension. Current diabetic treatment includes insulin injections. Her weight is fluctuating minimally. She is following a generally unhealthy diet. When asked about meal planning, she reported none. She has not  had a previous visit with a dietitian. She never participates in exercise. Her home blood glucose trend is increasing steadily. (She returns without any logs nor meter.  Her A1c is 12.2% largely unchanged from her last visit A1c.   ) An ACE inhibitor/angiotensin II receptor blocker is not being taken. Eye exam is current.  Hyperlipidemia  This is a chronic problem. The problem is uncontrolled. Exacerbating diseases include diabetes and obesity. Pertinent negatives include no chest pain, myalgias or shortness of breath. Current antihyperlipidemic treatment includes statins. Risk factors for coronary artery disease include dyslipidemia, diabetes mellitus, family history, hypertension, obesity, a sedentary lifestyle and post-menopausal.  Hypertension  This is a chronic problem. The current episode started more than 1 year ago. The problem is uncontrolled. Associated symptoms include blurred vision and headaches. Pertinent negatives include no chest pain, palpitations or shortness of breath. Risk factors for coronary artery disease include diabetes mellitus, dyslipidemia, obesity, sedentary lifestyle and post-menopausal state. Past treatments include calcium channel blockers and beta blockers. Hypertensive end-organ damage includes retinopathy.      Review of Systems  Constitutional: Positive for fatigue. Negative for chills, fever and unexpected weight change.       Patient's blood pressure was found to be extremely high this morning without symptoms.  Her first reading was 202/95, repeat blood pressure was 197/86.  HENT: Negative for trouble swallowing and voice change.   Eyes: Positive for blurred vision. Negative for visual disturbance.  Respiratory: Negative for cough, shortness of breath and wheezing.   Cardiovascular: Negative for chest pain, palpitations and leg swelling.  Gastrointestinal: Negative for diarrhea, nausea and vomiting.  Endocrine: Positive for polydipsia and polyuria. Negative  for cold intolerance, heat intolerance and polyphagia.  Musculoskeletal: Negative for arthralgias and myalgias.  Skin: Negative for color change, pallor, rash and wound.  Neurological: Positive for headaches. Negative for seizures.  Psychiatric/Behavioral: Negative for confusion and suicidal ideas.       Patient with mild to moderate cognitive deficit.    Objective:    BP (!) 202/95 (BP Location: Left Arm, Patient Position: Sitting, Cuff Size: Large)   Pulse 66   Ht 5\' 3"  (1.6 m)   Wt 202 lb (91.6 kg)   BMI 35.78 kg/m   Wt Readings from Last 3 Encounters:  08/07/18 202 lb (91.6 kg)  08/07/18 202 lb (91.6 kg)  03/17/18 203 lb (92.1 kg)     Physical Exam Constitutional:      Appearance: She is well-developed.  HENT:     Head: Normocephalic and atraumatic.  Neck:     Musculoskeletal: Normal range of motion and neck supple.     Thyroid: No thyromegaly.  Trachea: No tracheal deviation.  Cardiovascular:     Rate and Rhythm: Normal rate.  Pulmonary:     Effort: Pulmonary effort is normal.  Abdominal:     Tenderness: There is no abdominal tenderness. There is no guarding.  Musculoskeletal: Normal range of motion.  Skin:    General: Skin is warm and dry.     Coloration: Skin is not pale.     Findings: No erythema or rash.  Neurological:     Mental Status: She is alert and oriented to person, place, and time.     Cranial Nerves: No cranial nerve deficit.     Coordination: Coordination normal.     Deep Tendon Reflexes: Reflexes are normal and symmetric.  Psychiatric:        Judgment: Judgment normal.     Comments: Patient with significant cognitive deficit.      Recent Results (from the past 2160 hour(s))  Basic metabolic panel     Status: Abnormal   Collection Time: 07/21/18 12:00 AM  Result Value Ref Range   BUN 42 (A) 4 - 21   Creatinine 1.5 (A) 0.5 - 1.1  Hemoglobin A1c     Status: None   Collection Time: 07/21/18 12:00 AM  Result Value Ref Range    Hemoglobin A1C 12.2   Basic metabolic panel     Status: Abnormal   Collection Time: 08/07/18  1:10 PM  Result Value Ref Range   Sodium 139 135 - 145 mmol/L   Potassium 4.5 3.5 - 5.1 mmol/L   Chloride 108 98 - 111 mmol/L   CO2 24 22 - 32 mmol/L   Glucose, Bld 149 (H) 70 - 99 mg/dL   BUN 30 (H) 8 - 23 mg/dL   Creatinine, Ser 7.61 (H) 0.44 - 1.00 mg/dL   Calcium 9.1 8.9 - 47.0 mg/dL   GFR calc non Af Amer 50 (L) >60 mL/min   GFR calc Af Amer 58 (L) >60 mL/min   Anion gap 7 5 - 15    Comment: Performed at [facility], [address], [place], [place] [address]  Troponin I - Once     Status: None   Collection Time: 08/07/18  1:10 PM  Result Value Ref Range   Troponin I <0.03 <0.03 ng/mL    Comment: Performed at [facility], [address], [place], [place] [address]  CBC with Differential     Status: Abnormal   Collection Time: 08/07/18  1:10 PM  Result Value Ref Range   WBC 4.8 4.0 - 10.5 K/uL   RBC 3.54 (L) 3.87 - 5.11 MIL/uL   Hemoglobin 9.9 (L) 12.0 - 15.0 g/dL   HCT 37.0 (L) 96.4 - 38.3 %   MCV 88.7 80.0 - 100.0 fL   MCH 28.0 26.0 - 34.0 pg   MCHC 31.5 30.0 - 36.0 g/dL   RDW 81.8 40.3 - 75.4 %   Platelets 223 150 - 400 K/uL   nRBC 0.0 0.0 - 0.2 %   Neutrophils Relative % 47 %   Neutro Abs 2.3 1.7 - 7.7 K/uL   Lymphocytes Relative 35 %   Lymphs Abs 1.7 0.7 - 4.0 K/uL   Monocytes Relative 9 %   Monocytes Absolute 0.4 0.1 - 1.0 K/uL   Eosinophils Relative 8 %   Eosinophils Absolute 0.4 0.0 - 0.5 K/uL   Basophils Relative 1 %   Basophils Absolute 0.0 0.0 - 0.1 K/uL   Immature Granulocytes 0 %   Abs Immature Granulocytes 0.01  0.00 - 0.07 K/uL    Comment: Performed at [facility], [address], [place], [place] [address]   Lipid Panel     Component Value Date/Time   CHOL 212 (A) 09/19/2017   TRIG 77 09/19/2017   HDL 65 09/19/2017   LDLCALC 130 09/19/2017     Assessment & Plan:   1. Type 2 diabetes mellitus with stage 3 chronic kidney disease, coronary artery  disease, retinopathy with long-term current use of insulin (HCC)  - Jessica Weeks has currently uncontrolled symptomatic type 2 DM since 72 years of age. -She not bring any logs nor meter to review.  Her A1c remains high at 12.2%, unchanged from her last visit.  -Due to hypertensive urgency, she was sent to [facility] emergency room for better evaluation and treatment with a note.    -her diabetes is complicated by coronary artery disease, stage 3 renal insufficiency, retinopathy, peripheral neuropathy and Jessica Weeks remains at a high risk for more acute and chronic complications which include CAD, CVA, CKD, retinopathy, and neuropathy. These are all discussed in detail with the patient.  - I have counseled her on diet management and weight loss, by adopting a carbohydrate restricted/protein rich diet.   -  Suggestion is made for her to avoid simple carbohydrates  from her diet including Cakes, Sweet Desserts / Pastries, Ice Cream, Soda (diet and regular), Sweet Tea, Candies, Chips, Cookies, Store Bought Juices, Alcohol in Excess of  1-2 drinks a day, Artificial Sweeteners, and "Sugar-free" Products. This will help patient to have stable blood glucose profile and potentially avoid unintended weight gain.   - I encouraged her to switch to  unprocessed or minimally processed complex starch and increased protein intake (animal or plant source), fruits, and vegetables.  - she is advised to stick to a routine mealtimes to eat 3 meals  a day and avoid unnecessary snacks ( to snack only to correct hypoglycemia).   - she has been scheduled with Jessica Weeks, RDN, CDE for individualized diabetes education.  - I have approached her with the following individualized plan to manage diabetes and patient agrees:   -After her ER visit, she is advised to resume her insulin regimen as follows :  She is advised to resume and continue Lantus 30 units nightly, Humalog 10 units 3 times a day with  meals  for pre-meal BG readings of greater than or equal to 90mg /dl,  associated with strict monitoring of glucose 4 times a day-before meals and at bedtime.  -However, her compliance and cognitive ability is questionable .  Insulin dose adjustment will be performed slowly to avoid inadvertent hypoglycemia.  -She is accompanied by her daughter and son-in-law who offered to help her. -There is very little chance that this patient will care for self, if her family is not able to help, she should be evaluated for group home or nursing home.  -For simplicity reasons, she will be considered for premixed insulin to use twice a day once she uses her current supplies of Lantus and Humalog.  -Patient is encouraged to call clinic for blood glucose levels less than 70 or above 300 mg /dl. -She would have benefited from low-dose metformin, however she does not tolerate metformin. -Patient is not a candidate for SGLT2 inhibitors due to CKD.  - she will be considered for incretin therapy as appropriate next visit. - Patient specific target  A1c;  LDL, HDL, Triglycerides, and  Waist Circumference were discussed in detail.  2)  BP/HTN: Her blood pressure is extremely high.  She is sent to [facility] emergency room for better evaluation and treatment.    She is advised to continue current medications including nifedipine  and carvedilol.   3) Lipids/HPL:   Her most recent lipid panel showed LDL of 130 consistent with uncontrolled profile.  She is advised to continue her atorvastatin 20 mg p.o. nightly.     4)  Weight/Diet: CDE Consult is been initiated , exercise, and detailed carbohydrates information provided.  5) Chronic Care/Health Maintenance:  -she  Is on Statin medications and  is encouraged to continue to follow up with Ophthalmology, Dentist,  Podiatrist at least yearly or according to recommendations, and advised to  stay away from smoking. I have recommended yearly flu vaccine and pneumonia  vaccination at least every 5 years; moderate intensity exercise for up to 150 minutes weekly; and  sleep for at least 7 hours a day.  - I advised patient to maintain close follow up with Smith Robert, MD for primary care needs.  - Time spent with the patient: 25 min, of which >50% was spent in reviewing her  current and  previous labs and insulin doses and developing a plan to avoid hypo- and hyper-glycemia. Please refer to Patient Instructions for Blood Glucose Monitoring and Insulin/Medications Dosing Guide"  in media tab for additional information. Sanjuana Mae participated in the discussions, expressed understanding, and voiced agreement with the above plans.  All questions were answered to her satisfaction. she is encouraged to contact clinic should she have any questions or concerns prior to her return visit.   Follow up plan: - Return in about 10 days (around 08/17/2018), or Pt to ER for Hypertensive Urgency.  Marquis Lunch, MD [facility] [address] [place], [place] [address] Phone: [phone]  Fax: [phone]    08/07/2018, 2:36 PM  This note was partially dictated with voice recognition software. Similar sounding words can be transcribed inadequately or may not  be corrected upon review.

## 2018-08-07 NOTE — ED Provider Notes (Signed)
[facility] EMERGENCY DEPARTMENT Provider Note   CSN: [id number] Arrival date & time: 08/07/18  1119     History   Chief Complaint Chief Complaint  Patient presents with  . Hypertension    HPI Jessica Weeks is a 72 y.o. female.  HPI  Pt was seen at 1235. Per pt, c/o unknown onset and persistence of constant "high blood pressure" that was noted at her Endo MD's office this morning PTA. Pt was sent to the ED for evaluation. Pt denies any symptoms. Denies visual changes, no focal motor weakness, no tingling/numbness in extremities, no ataxia, no slurred speech, no facial droop, no CP/palpitations, no SOB/cough, no abd pain, no N/V/D.    Past Medical History:  Diagnosis Date  . CAD (coronary artery disease)   . Diabetes mellitus, type II (HCC)   . Hyperlipidemia   . Hypertension     Patient Active Problem List   Diagnosis Date Noted  . Uncontrolled type 2 diabetes mellitus with hyperglycemia (HCC) 02/17/2018  . Type 2 diabetes mellitus with stage 3 chronic kidney disease, with long-term current use of insulin (HCC) 02/03/2018  . Mixed hyperlipidemia 02/03/2018  . Essential hypertension, benign 02/03/2018  . Class 2 severe obesity due to excess calories with serious comorbidity and body mass index (BMI) of 35.0 to 35.9 in adult [facility]) 02/03/2018    Past Surgical History:  Procedure Laterality Date  . EYE SURGERY       OB History   No obstetric history on file.      Home Medications    Prior to Admission medications   Medication Sig Start Date End Date Taking? Authorizing Provider  amitriptyline (ELAVIL) 10 MG tablet at bedtime. 01/14/18  Yes [provider]  atorvastatin (LIPITOR) 20 MG tablet daily. 01/14/18  Yes [provider]  carvedilol (COREG) 25 MG tablet 2 (two) times daily with a meal.  01/14/18  Yes [provider]  clopidogrel (PLAVIX) 75 MG tablet daily. 01/14/18  Yes [provider]  ezetimibe (ZETIA) 10 MG tablet  daily. 01/14/18  Yes [provider]  furosemide (LASIX) 40 MG tablet daily. 01/14/18  Yes [provider]  gabapentin (NEURONTIN) 100 MG capsule 200 mg 3 (three) times daily. 01/14/18  Yes [provider]  GNP ASPIRIN LOW DOSE 81 MG EC tablet daily. 01/14/18  Yes [provider]  HUMALOG KWIKPEN 100 UNIT/ML KiwkPen Inject 10 Units into the skin 3 (three) times daily before meals. 11/23/17  Yes [provider]  Insulin Glargine (LANTUS SOLOSTAR) 100 UNIT/ML Solostar Pen Inject 30 Units into the skin at bedtime.   Yes [provider]  NIFEdipine (PROCARDIA-XL/ADALAT CC) 30 MG 24 hr tablet 2 (two) times daily. 01/14/18  Yes [provider]  brimonidine (ALPHAGAN) 0.2 % ophthalmic solution 2 (two) times daily. 11/28/17   [provider]    Family History Family History  Problem Relation Age of Onset  . Diabetes Mother   . Hypertension Mother   . Hypertension Father   . Cancer Father     Social History Social History   Tobacco Use  . Smoking status: Never Smoker  . Smokeless tobacco: Never Used  Substance Use Topics  . Alcohol use: Never    Frequency: Never  . Drug use: Never     Allergies   Lisinopril and Novolog [insulin aspart]   Review of Systems Review of Systems ROS: Statement: All systems negative except as marked or noted in the HPI; Constitutional: Negative for fever and  chills. ; ; Eyes: Negative for eye pain, redness and discharge. ; ; ENMT: Negative for ear pain, hoarseness, nasal congestion, sinus pressure and sore throat. ; ; Cardiovascular: Negative for chest pain, palpitations, diaphoresis, dyspnea and peripheral edema. ; ; Respiratory: Negative for cough, wheezing and stridor. ; ; Gastrointestinal: Negative for nausea, vomiting, diarrhea, abdominal pain, blood in stool, hematemesis, jaundice and rectal bleeding. . ; ; Genitourinary: Negative for dysuria, flank pain and hematuria. ; ; Musculoskeletal:  Negative for back pain and neck pain. Negative for swelling and trauma.; ; Skin: Negative for pruritus, rash, abrasions, blisters, bruising and skin lesion.; ; Neuro: Negative for headache, lightheadedness and neck stiffness. Negative for weakness, altered level of consciousness, altered mental status, extremity weakness, paresthesias, involuntary movement, seizure and syncope.       Physical Exam Updated Vital Signs BP (!) 150/66   Pulse 64   Temp 97.7 F (36.5 C) (Oral)   Resp (!) 21   Ht 5\' 3"  (1.6 m)   Wt 91.6 kg   SpO2 95%   BMI 35.78 kg/m    Patient Vitals for the past 24 hrs:  BP Temp Temp src Pulse Resp SpO2 Height Weight  08/07/18 1517 (!) 155/70 - - 61 17 95 % - -  08/07/18 1400 (!) 150/66 - - 64 (!) 21 95 % - -  08/07/18 1345 - - - 64 19 100 % - -  08/07/18 1330 (!) 140/97 - - 60 13 100 % - -  08/07/18 1319 118/64 - - (!) 57 15 98 % - -  08/07/18 1315 (!) 161/74 - - 60 19 99 % - -  08/07/18 1300 (!) 182/99 - - - (!) 24 - - -  08/07/18 1245 - - - 63 (!) 28 97 % - -  08/07/18 1230 (!) 184/77 - - - 19 - - -  08/07/18 1215 - - - - 19 - - -  08/07/18 1200 (!) 159/75 - - - 16 - - -  08/07/18 1145 - - - 63 (!) 35 100 % - -  08/07/18 1133 (!) 171/76 97.7 F (36.5 C) Oral 62 10 98 % 5\' 3"  (1.6 m) 91.6 kg  08/07/18 1130 (!) 171/76 - - 63 13 99 % - -  08/07/18 1129 (!) 171/76 - - 63 - 99 % - -    13:13 Orthostatic Vital Signs VB  Orthostatic Lying   BP- Lying: 147/79  Pulse- Lying: 70      Orthostatic Sitting  BP- Sitting: 161/74  Pulse- Sitting: 59      Orthostatic Standing at 0 minutes  BP- Standing at 0 minutes: 118/64  Pulse- Standing at 0 minutes: 58    Physical Exam 1240: Physical examination:  Nursing notes reviewed; Vital signs and O2 SAT reviewed;  Constitutional: Well developed, Well nourished, Well hydrated, In no acute distress; Head:  Normocephalic, atraumatic; Eyes: EOMI, PERRL, No scleral icterus; ENMT: Mouth and pharynx normal, Mucous membranes  moist; Neck: Supple, Full range of motion, No lymphadenopathy; Cardiovascular: Regular rate and rhythm, No gallop; Respiratory: Breath sounds clear & equal bilaterally, No wheezes.  Speaking full sentences with ease, Normal respiratory effort/excursion; Chest: Nontender, Movement normal; Abdomen: Soft, Nontender, Nondistended, Normal bowel sounds; Genitourinary: No CVA tenderness; Extremities: Peripheral pulses normal, No tenderness, No edema, No calf edema or asymmetry.; Neuro: AA&Ox3, Major CN grossly intact. No facial droop. Speech clear. Grips equal. Strength 5/5 equal bilat UE's and LE's. No gross focal motor or sensory deficits in extremities.  Climbs on and off stretcher easily by herself. Gait steady..; Skin: Color normal, Warm, Dry.   ED Treatments / Results  Labs (all labs ordered are listed, but only abnormal results are displayed)   EKG EKG Interpretation  Date/Time:  Thursday August 07 2018 11:30:27 EST Ventricular Rate:  63 PR Interval:    QRS Duration: 84 QT Interval:  405 QTC Calculation: 415 R Axis:   101 Text Interpretation:  Sinus rhythm Baseline wander Poor data quality suggest repeat tracing Reconfirmed by Samuel JesterMcManus, Kyrian Stage [phone]([id number]) on 08/07/2018 3:15:09 PM   EKG Interpretation  Date/Time:  Thursday August 07 2018 15:20:22 EST Ventricular Rate:  62 PR Interval:    QRS Duration: 87 QT Interval:  423 QTC Calculation: 430 R Axis:   104 Text Interpretation:  Sinus rhythm Right axis deviation Low voltage, precordial leads Nonspecific repol abnormality, diffuse leads Baseline wander Artifact Since last tracing of earlier today No significant change was found Confirmed by Samuel JesterMcManus, Ozil Stettler [phone]) on 08/07/2018 3:23:26 PM        Radiology   Procedures Procedures (including critical care time)  Medications Ordered in ED Medications - No data to display   Initial Impression / Assessment and Plan / ED Course  I have reviewed the triage vital signs and the  nursing notes.  Pertinent labs & imaging results that were available during my care of the patient were reviewed by me and considered in my medical decision making (see chart for details).  MDM Reviewed: previous chart, nursing note and vitals Reviewed previous: labs and ECG Interpretation: labs, ECG, x-ray and CT scan   Results for orders placed or performed during the hospital encounter of 08/07/18  Basic metabolic panel  Result Value Ref Range   Sodium 139 135 - 145 mmol/L   Potassium 4.5 3.5 - 5.1 mmol/L   Chloride 108 98 - 111 mmol/L   CO2 24 22 - 32 mmol/L   Glucose, Bld 149 (H) 70 - 99 mg/dL   BUN 30 (H) 8 - 23 mg/dL   Creatinine, Ser 0.981.12 (H) 0.44 - 1.00 mg/dL   Calcium 9.1 8.9 - 11.910.3 mg/dL   GFR calc non Af Amer 50 (L) >60 mL/min   GFR calc Af Amer 58 (L) >60 mL/min   Anion gap 7 5 - 15  Troponin I - Once  Result Value Ref Range   Troponin I <0.03 <0.03 ng/mL  CBC with Differential  Result Value Ref Range   WBC 4.8 4.0 - 10.5 K/uL   RBC 3.54 (L) 3.87 - 5.11 MIL/uL   Hemoglobin 9.9 (L) 12.0 - 15.0 g/dL   HCT 14.731.4 (L) 82.936.0 - 56.246.0 %   MCV 88.7 80.0 - 100.0 fL   MCH 28.0 26.0 - 34.0 pg   MCHC 31.5 30.0 - 36.0 g/dL   RDW 13.013.0 86.511.5 - 78.415.5 %   Platelets 223 150 - 400 K/uL   nRBC 0.0 0.0 - 0.2 %   Neutrophils Relative % 47 %   Neutro Abs 2.3 1.7 - 7.7 K/uL   Lymphocytes Relative 35 %   Lymphs Abs 1.7 0.7 - 4.0 K/uL   Monocytes Relative 9 %   Monocytes Absolute 0.4 0.1 - 1.0 K/uL   Eosinophils Relative 8 %   Eosinophils Absolute 0.4 0.0 - 0.5 K/uL   Basophils Relative 1 %   Basophils Absolute 0.0 0.0 - 0.1 K/uL   Immature Granulocytes 0 %   Abs Immature Granulocytes 0.01 0.00 - 0.07 K/uL  Urinalysis, Routine w reflex  microscopic  Result Value Ref Range   Color, Urine YELLOW YELLOW   APPearance HAZY (A) CLEAR   Specific Gravity, Urine 1.015 1.005 - 1.030   pH 5.0 5.0 - 8.0   Glucose, UA NEGATIVE NEGATIVE mg/dL   Hgb urine dipstick SMALL (A) NEGATIVE    Bilirubin Urine NEGATIVE NEGATIVE   Ketones, ur NEGATIVE NEGATIVE mg/dL   Protein, ur 811 (A) NEGATIVE mg/dL   Nitrite NEGATIVE NEGATIVE   Leukocytes, UA MODERATE (A) NEGATIVE   RBC / HPF 0-5 0 - 5 RBC/hpf   WBC, UA 21-50 0 - 5 WBC/hpf   Bacteria, UA MANY (A) NONE SEEN   Squamous Epithelial / LPF 0-5 0 - 5   Mucus PRESENT    Hyaline Casts, UA PRESENT    Dg Chest 2 View Result Date: 08/07/2018 CLINICAL DATA:  Hypertension. EXAM: CHEST - 2 VIEW COMPARISON:  Radiographs of May 11, 2015. FINDINGS: The heart size and mediastinal contours are within normal limits. Both lungs are clear. The visualized skeletal structures are unremarkable. IMPRESSION: No active cardiopulmonary disease. Electronically Signed   By: Lupita Raider, M.D.   On: 08/07/2018 13:03   Ct Head Wo Contrast Result Date: 08/07/2018 CLINICAL DATA:  Hypertension with dizziness EXAM: CT HEAD WITHOUT CONTRAST TECHNIQUE: Contiguous axial images were obtained from the base of the skull through the vertex without intravenous contrast. COMPARISON:  None. FINDINGS: Brain: There is age related volume loss. There is no intracranial mass, hemorrhage, extra-axial fluid collection, or midline shift. There is mild patchy small vessel disease in the centra semiovale bilaterally. No acute infarct is demonstrable on this study. Vascular: There is no hyperdense vessel. There is calcification in each carotid siphon region. Skull: The bony calvarium appears intact. Sinuses/Orbits: There is mucosal thickening in several ethmoid air cells. Other visualized paranasal sinuses are clear. Orbits appear symmetric bilaterally. Patient has had cataract removals bilaterally. Other: Mastoid air cells are clear. IMPRESSION: Mild patchy periventricular small vessel disease. No acute infarct evident. No mass or hemorrhage. There are foci of arterial vascular calcification. There is mucosal thickening in several ethmoid air cells. Electronically Signed   By:  Bretta Bang III M.D.   On: 08/07/2018 14:00    1600:  Per Epic chart review: Last labs 12/18/2017: Hgb 10.3/HCT 31/4. +UTI, UC pending. Pt continues to deny any complaints and would like to go home now. Has tol PO well without N/V. Neuro exam intact/unchanged. BP improved without intervention.  Dx and testing d/w pt and family.  Questions answered.  Verb understanding, agreeable to d/c home with outpt f/u.      Final Clinical Impressions(s) / ED Diagnoses   Final diagnoses:  None    ED Discharge Orders    None       Samuel Jester, DO 08/11/18 [id number]

## 2018-08-10 LAB — URINE CULTURE: Culture: >=100000 — AB

## 2018-08-11 ENCOUNTER — Telehealth: Payer: Self-pay | Admitting: Emergency Medicine

## 2018-08-11 NOTE — Telephone Encounter (Signed)
Post ED Visit - Positive Culture Follow-up  Culture report reviewed by antimicrobial stewardship pharmacist:  []  Enzo Bi, Pharm.D. []  Celedonio Miyamoto, Pharm.D., BCPS AQ-ID []  Garvin Fila, Pharm.D., BCPS []  Georgina Pillion, Pharm.D., BCPS []  Rex, [address].D., BCPS, AAHIVP []  Estella Husk, Pharm.D., BCPS, AAHIVP [x]  Lysle Pearl, PharmD, BCPS []  Phillips Climes, PharmD, BCPS []  Agapito Games, PharmD, BCPS []  Verlan Friends, PharmD  Positive Urine culture Treated with Cephalexin, organism sensitive to the same and no further patient follow-up is required at this time.  Carollee Herter Ewell Benassi 08/11/2018, 9:44 AM

## 2018-08-25 ENCOUNTER — Encounter: Payer: Self-pay | Admitting: "Endocrinology

## 2018-08-25 ENCOUNTER — Ambulatory Visit (INDEPENDENT_AMBULATORY_CARE_PROVIDER_SITE_OTHER): Payer: Medicare Other | Admitting: "Endocrinology

## 2018-08-25 VITALS — BP 160/77 | HR 65 | Ht 63.0 in | Wt 211.0 lb

## 2018-08-25 DIAGNOSIS — E782 Mixed hyperlipidemia: Secondary | ICD-10-CM

## 2018-08-25 DIAGNOSIS — E1165 Type 2 diabetes mellitus with hyperglycemia: Secondary | ICD-10-CM

## 2018-08-25 DIAGNOSIS — I10 Essential (primary) hypertension: Secondary | ICD-10-CM | POA: Diagnosis not present

## 2018-08-25 NOTE — Progress Notes (Signed)
Endocrinology follow-up note       08/25/2018, 1:23 PM   Subjective:    Patient ID: Jessica Weeks, female    DOB: 1945-09-19.  Jessica Weeks is being seen in follow-up for management of currently uncontrolled symptomatic type 2 diabetes, hypertension, hyperlipidemia.  PMD:  Smith Robert, MD.   Past Medical History:  Diagnosis Date  . Weeks (coronary artery disease)   . Diabetes mellitus, type II (HCC)   . Hyperlipidemia   . Hypertension    Past Surgical History:  Procedure Laterality Date  . EYE SURGERY     Social History   Socioeconomic History  . Marital status: Divorced    Spouse name: Not on file  . Number of children: Not on file  . Years of education: Not on file  . Highest education level: Not on file  Occupational History  . Not on file  Social Needs  . Financial resource strain: Not on file  . Food insecurity:    Worry: Not on file    Inability: Not on file  . Transportation needs:    Medical: Not on file    Non-medical: Not on file  Tobacco Use  . Smoking status: Never Smoker  . Smokeless tobacco: Never Used  Substance Jessica Sexual Activity  . Alcohol use: Never    Frequency: Never  . Drug use: Never  . Sexual activity: Not on file  Lifestyle  . Physical activity:    Days per week: Not on file    Minutes per session: Not on file  . Stress: Not on file  Relationships  . Social connections:    Talks on phone: Not on file    Gets together: Not on file    Attends religious service: Not on file    Active member of club or organization: Not on file    Attends meetings of clubs or organizations: Not on file    Relationship status: Not on file  Other Topics Concern  . Not on file  Social History Narrative  . Not on file   Outpatient Encounter Medications as of 08/25/2018  Medication Sig  . amitriptyline (ELAVIL) 10 MG tablet at bedtime.  Marland Kitchen atorvastatin (LIPITOR) 20 MG  tablet daily.  . brimonidine (ALPHAGAN) 0.2 % ophthalmic solution 2 (two) times daily.  . carvedilol (COREG) 25 MG tablet 2 (two) times daily with a meal.   . cephALEXin (KEFLEX) 500 MG capsule Take 1 capsule (500 mg total) by mouth 4 (four) times daily.  . clopidogrel (PLAVIX) 75 MG tablet daily.  Marland Kitchen ezetimibe (ZETIA) 10 MG tablet daily.  . furosemide (LASIX) 40 MG tablet daily.  Marland Kitchen gabapentin (NEURONTIN) 100 MG capsule 200 mg 3 (three) times daily.  . GNP ASPIRIN LOW DOSE 81 MG EC tablet daily.  Marland Kitchen HUMALOG KWIKPEN 100 UNIT/ML KiwkPen Inject 10 Units into the skin 3 (three) times daily before meals.  . Insulin Glargine (LANTUS SOLOSTAR) 100 UNIT/ML Solostar Pen Inject 30 Units into the skin at bedtime.  Marland Kitchen NIFEdipine (PROCARDIA-XL/ADALAT CC) 30 MG 24 hr tablet 2 (two) times daily.   No facility-administered encounter medications on file as of 08/25/2018.  ALLERGIES: Allergies  Allergen Reactions  . Lisinopril Itching  . Novolog [Insulin Aspart] Hives    VACCINATION STATUS:  There is no immunization history on file for this patient.  Diabetes  She presents for her follow-up diabetic visit. She has type 2 diabetes mellitus. Onset time: She was diagnosed at approximate age of 6 years. Her disease course has been fluctuating. Hypoglycemia symptoms include headaches. Pertinent negatives for hypoglycemia include no confusion, pallor or seizures. Associated symptoms include blurred vision, fatigue, polydipsia Jessica polyuria. Pertinent negatives for diabetes include no chest pain Jessica no polyphagia. There are no hypoglycemic complications. Symptoms are worsening. Diabetic complications include heart disease, nephropathy, peripheral neuropathy Jessica Jessica Weeks. Risk factors for coronary artery disease include dyslipidemia, family history, obesity, sedentary lifestyle, post-menopausal Jessica hypertension. Current diabetic treatment includes insulin injections. Her weight is fluctuating minimally. She is  following a generally unhealthy diet. When asked about meal planning, she reported none. She has not had a previous visit with a dietitian. She never participates in exercise. Her home blood glucose trend is increasing steadily. (She returns with out her meter, incomplete blood glucose logs.  Her recent A1c was high at 12.2%.    ) An ACE inhibitor/angiotensin II receptor blocker is not being taken. Eye exam is current.  Hyperlipidemia  This is a chronic problem. The problem is uncontrolled. Exacerbating diseases include diabetes Jessica obesity. Pertinent negatives include no chest pain, myalgias or shortness of breath. Current antihyperlipidemic treatment includes statins. Risk factors for coronary artery disease include dyslipidemia, diabetes mellitus, family history, hypertension, obesity, a sedentary lifestyle Jessica post-menopausal.  Hypertension  This is a chronic problem. The current episode started more than 1 year ago. The problem is uncontrolled. Associated symptoms include blurred vision Jessica headaches. Pertinent negatives include no chest pain, palpitations or shortness of breath. Risk factors for coronary artery disease include diabetes mellitus, dyslipidemia, obesity, sedentary lifestyle Jessica post-menopausal state. Past treatments include calcium channel blockers Jessica beta blockers. Hypertensive end-organ damage includes Jessica Weeks.    Review of Systems  Constitutional: Positive for fatigue. Negative for chills, fever Jessica unexpected weight change.  HENT: Negative for trouble swallowing Jessica voice change.   Eyes: Positive for blurred vision. Negative for visual disturbance.  Respiratory: Negative for cough, shortness of breath Jessica wheezing.   Cardiovascular: Negative for chest pain, palpitations Jessica leg swelling.  Gastrointestinal: Negative for diarrhea, nausea Jessica vomiting.  Endocrine: Positive for polydipsia Jessica polyuria. Negative for cold intolerance, heat intolerance Jessica polyphagia.   Musculoskeletal: Negative for arthralgias Jessica myalgias.  Skin: Negative for color change, pallor, rash Jessica wound.  Neurological: Positive for headaches. Negative for seizures.  Psychiatric/Behavioral: Negative for confusion Jessica suicidal ideas.       Patient with mild to moderate cognitive deficit.    Objective:    BP (!) 160/77   Pulse 65   Ht 5\' 3"  (1.6 m)   Wt 211 lb (95.7 kg)   BMI 37.38 kg/m   Wt Readings from Last 3 Encounters:  08/25/18 211 lb (95.7 kg)  08/07/18 202 lb (91.6 kg)  08/07/18 202 lb (91.6 kg)     Physical Exam Constitutional:      Appearance: She is well-developed.  HENT:     Head: Normocephalic Jessica atraumatic.  Neck:     Musculoskeletal: Normal range of motion Jessica neck supple.     Thyroid: No thyromegaly.     Trachea: No tracheal deviation.  Cardiovascular:     Rate Jessica Rhythm: Normal rate.  Pulmonary:  Effort: Pulmonary effort is normal.  Abdominal:     Tenderness: There is no abdominal tenderness. There is no guarding.  Musculoskeletal: Normal range of motion.  Skin:    General: Skin is warm Jessica dry.     Coloration: Skin is not pale.     Findings: No erythema or rash.  Neurological:     Mental Status: She is alert Jessica oriented to person, place, Jessica time.     Cranial Nerves: No cranial nerve deficit.     Coordination: Coordination normal.     Deep Tendon Reflexes: Reflexes are normal Jessica symmetric.  Psychiatric:        Judgment: Judgment normal.     Comments: Patient with significant cognitive deficit.      Recent Results (from the past 2160 hour(s))  Basic metabolic panel     Status: Abnormal   Collection Time: 07/21/18 12:00 AM  Result Value Ref Range   BUN 42 (A) 4 - 21   Creatinine 1.5 (A) 0.5 - 1.1  Hemoglobin A1c     Status: None   Collection Time: 07/21/18 12:00 AM  Result Value Ref Range   Hemoglobin A1C 12.2   Urinalysis, Routine w reflex microscopic     Status: Abnormal   Collection Time: 08/07/18 12:41 PM  Result  Value Ref Range   Color, Urine YELLOW YELLOW   APPearance HAZY (A) CLEAR   Specific Gravity, Urine 1.015 1.005 - 1.030   pH 5.0 5.0 - 8.0   Glucose, UA NEGATIVE NEGATIVE mg/dL   Hgb urine dipstick SMALL (A) NEGATIVE   Bilirubin Urine NEGATIVE NEGATIVE   Ketones, ur NEGATIVE NEGATIVE mg/dL   Protein, ur 500 (A) NEGATIVE mg/dL   Nitrite NEGATIVE NEGATIVE   Leukocytes, UA MODERATE (A) NEGATIVE   RBC / HPF 0-5 0 - 5 RBC/hpf   WBC, UA 21-50 0 - 5 WBC/hpf   Bacteria, UA MANY (A) NONE SEEN   Squamous Epithelial / LPF 0-5 0 - 5   Mucus PRESENT    Hyaline Casts, UA PRESENT     Comment: Performed at [facility], [address] [place], [place] [address]  Urine culture     Status: Abnormal   Collection Time: 08/07/18 12:41 PM  Result Value Ref Range   Specimen Description      URINE, RANDOM Performed at [facility], [address], [place], [place] [address]    Special Requests      NONE Performed at [facility], [address] [place], [place] [address]    Culture (A)     >=100,000 COLONIES/mL KLEBSIELLA PNEUMONIAE >=100,000 COLONIES/mL ESCHERICHIA COLI    Report Status 08/10/2018 FINAL    Organism ID, Bacteria KLEBSIELLA PNEUMONIAE (A)    Organism ID, Bacteria ESCHERICHIA COLI (A)       Susceptibility   Escherichia coli - MIC*    AMPICILLIN >=32 RESISTANT Resistant     CEFAZOLIN <=4 SENSITIVE Sensitive     CEFTRIAXONE <=1 SENSITIVE Sensitive     CIPROFLOXACIN <=0.25 SENSITIVE Sensitive     GENTAMICIN <=1 SENSITIVE Sensitive     IMIPENEM <=0.25 SENSITIVE Sensitive     NITROFURANTOIN 64 INTERMEDIATE Intermediate     TRIMETH/SULFA <=20 SENSITIVE Sensitive     AMPICILLIN/SULBACTAM >=32 RESISTANT Resistant     PIP/TAZO <=4 SENSITIVE Sensitive     Extended ESBL NEGATIVE Sensitive     * >=100,000 COLONIES/mL ESCHERICHIA COLI   Klebsiella pneumoniae - MIC*    AMPICILLIN >=32 RESISTANT Resistant  CEFAZOLIN <=4 SENSITIVE Sensitive     CEFTRIAXONE <=1 SENSITIVE Sensitive      CIPROFLOXACIN <=0.25 SENSITIVE Sensitive     GENTAMICIN <=1 SENSITIVE Sensitive     IMIPENEM <=0.25 SENSITIVE Sensitive     NITROFURANTOIN <=16 SENSITIVE Sensitive     TRIMETH/SULFA <=20 SENSITIVE Sensitive     AMPICILLIN/SULBACTAM 4 SENSITIVE Sensitive     PIP/TAZO <=4 SENSITIVE Sensitive     Extended ESBL NEGATIVE Sensitive     * >=100,000 COLONIES/mL KLEBSIELLA PNEUMONIAE  Basic metabolic panel     Status: Abnormal   Collection Time: 08/07/18  1:10 PM  Result Value Ref Range   Sodium 139 135 - 145 mmol/L   Potassium 4.5 3.5 - 5.1 mmol/L   Chloride 108 98 - 111 mmol/L   CO2 24 22 - 32 mmol/L   Glucose, Bld 149 (H) 70 - 99 mg/dL   BUN 30 (H) 8 - 23 mg/dL   Creatinine, Ser 5.39 (H) 0.44 - 1.00 mg/dL   Calcium 9.1 8.9 - 76.7 mg/dL   GFR calc non Af Amer 50 (L) >60 mL/min   GFR calc Af Amer 58 (L) >60 mL/min   Anion gap 7 5 - 15    Comment: Performed at [facility], [address] [place], [place] [address]  Troponin I - Once     Status: None   Collection Time: 08/07/18  1:10 PM  Result Value Ref Range   Troponin I <0.03 <0.03 ng/mL    Comment: Performed at [facility], [address], [place], [place] [address]  CBC with Differential     Status: Abnormal   Collection Time: 08/07/18  1:10 PM  Result Value Ref Range   WBC 4.8 4.0 - 10.5 K/uL   RBC 3.54 (L) 3.87 - 5.11 MIL/uL   Hemoglobin 9.9 (L) 12.0 - 15.0 g/dL   HCT 09.7 (L) 35.3 - 29.9 %   MCV 88.7 80.0 - 100.0 fL   MCH 28.0 26.0 - 34.0 pg   MCHC 31.5 30.0 - 36.0 g/dL   RDW 24.2 68.3 - 41.9 %   Platelets 223 150 - 400 K/uL   nRBC 0.0 0.0 - 0.2 %   Neutrophils Relative % 47 %   Neutro Abs 2.3 1.7 - 7.7 K/uL   Lymphocytes Relative 35 %   Lymphs Abs 1.7 0.7 - 4.0 K/uL   Monocytes Relative 9 %   Monocytes Absolute 0.4 0.1 - 1.0 K/uL   Eosinophils Relative 8 %   Eosinophils Absolute 0.4 0.0 - 0.5 K/uL   Basophils Relative 1 %   Basophils Absolute 0.0 0.0 - 0.1 K/uL   Immature Granulocytes 0 %   Abs Immature  Granulocytes 0.01 0.00 - 0.07 K/uL    Comment: Performed at [facility], [address], [place], [place] [address]   Lipid Panel     Component Value Date/Time   CHOL 212 (A) 09/19/2017   TRIG 77 09/19/2017   HDL 65 09/19/2017   LDLCALC 130 09/19/2017     Assessment & Plan:   1. Type 2 diabetes mellitus with stage 3 chronic kidney disease, coronary artery disease, Jessica Weeks with long-term current use of insulin (HCC)  - Jessica Weeks has currently uncontrolled symptomatic type 2 DM since 73 years of age. -She did not bring her meter, her logs are incomplete.   Her recent  A1c remains high at 12.2%, unchanged from her previous visit.  her diabetes is complicated by coronary artery disease, stage 3 renal insufficiency, Jessica Weeks,  peripheral neuropathy Jessica Jessica Weeks, Jessica Weeks, Jessica Weeks, Jessica Weeks, Jessica neuropathy. These are all discussed in detail with the patient.  - I have counseled her on diet management Jessica weight loss, by adopting a carbohydrate restricted/protein rich diet. -  Suggestion is made for her to avoid simple carbohydrates  from her diet including Cakes, Sweet Desserts / Pastries, Ice Cream, Soda (diet Jessica regular), Sweet Tea, Candies, Chips, Cookies, Store Bought Juices, Alcohol in Excess of  1-2 drinks a day, Artificial Sweeteners, Jessica "Sugar-free" Products. This will help patient to have stable blood glucose profile Jessica potentially avoid unintended weight gain.  - I encouraged her to switch to  unprocessed or minimally processed complex starch Jessica increased protein intake (animal or plant source), fruits, Jessica vegetables.  - she is advised to stick to a routine mealtimes to eat 3 meals  a day Jessica avoid unnecessary snacks ( to snack only to correct hypoglycemia).   - she has been scheduled with Norm Salt, RDN, CDE for individualized diabetes education.  - I have approached her with the  following individualized plan to manage diabetes Jessica patient agrees:   -She continues to struggle to follow recommended treatment regimen.  She is coming to clinic alone today.  However, she reports that her daughter-in-law is willing to help her. She is advised to resume Jessica continue Lantus 30 units nightly, Humalog 10 units 3 times a day with meals  for pre-meal BG readings of greater than or equal to 90mg /dl,  associated with strict monitoring of glucose 4 times a day-before meals Jessica at bedtime.  -However, her compliance Jessica cognitive ability is questionable .  Insulin dose adjustment will be performed slowly to avoid inadvertent hypoglycemia.  -There is very little chance that this patient will care for self, if her family is not able to help, she should be evaluated for group home or nursing home.  -For simplicity reasons, she will be considered for premixed insulin to use twice a day once she uses her current supplies of Lantus Jessica Humalog.  -Patient is encouraged to call clinic for blood glucose levels less than 70 or above 300 mg /dl. -She would have benefited from low-dose metformin, however she does not tolerate metformin. -Patient is not a candidate for SGLT2 inhibitors due to Jessica Weeks.  - she will be considered for incretin therapy as appropriate next visit. - Patient specific target  A1c;  LDL, HDL, Triglycerides, Jessica  Waist Circumference were discussed in detail.  2) BP/HTN: Her blood pressure is better today at 160/77.     She is advised to continue current medications including nifedipine  Jessica carvedilol.   3) Lipids/HPL:   Her most recent lipid panel showed LDL of 130 consistent with uncontrolled profile.  She is advised to continue her atorvastatin 20 mg p.o. nightly.     4)  Weight/Diet: CDE Consult is been initiated , exercise, Jessica detailed carbohydrates information provided.  5) Chronic Care/Health Maintenance:  -she  Is on Statin medications Jessica  is encouraged to continue  to follow up with Ophthalmology, Dentist,  Podiatrist at least yearly or according to recommendations, Jessica advised to  stay away from smoking. I have recommended yearly flu vaccine Jessica pneumonia vaccination at least every 5 years; moderate intensity exercise for up to 150 minutes weekly; Jessica  sleep for at least 7 hours a day.  - I advised patient to maintain close follow up with  Smith RobertKikel, Stephen, MD for primary care needs. - Time spent with the patient: 25 min, of which >50% was spent in reviewing her blood glucose logs , discussing her hypo- Jessica hyper-glycemic episodes, reviewing her current Jessica  previous labs Jessica insulin doses Jessica developing a plan to avoid hypo- Jessica hyper-glycemia. Please refer to Patient Instructions for Blood Glucose Monitoring Jessica Insulin/Medications Dosing Guide"  in media tab for additional information. Jessica Weeks participated in the discussions, expressed understanding, Jessica voiced agreement with the above plans.  All questions were answered to her satisfaction. she is encouraged to contact clinic should she have any questions or concerns prior to her return visit.  Follow up plan: - Return in about 10 days (around 09/04/2018) for Follow up with Meter Jessica Logs Only - no Labs.  Marquis LunchGebre Tymeka Privette, MD [facility] [address] [place], [place] [address] Phone: [phone]  Fax: [phone]    08/25/2018, 1:23 PM  This note was partially dictated with voice recognition software. Similar sounding words can be transcribed inadequately or may not  be corrected upon review.

## 2018-08-25 NOTE — Patient Instructions (Signed)

## 2018-09-08 ENCOUNTER — Encounter: Payer: Self-pay | Admitting: "Endocrinology

## 2018-09-08 ENCOUNTER — Ambulatory Visit (INDEPENDENT_AMBULATORY_CARE_PROVIDER_SITE_OTHER): Payer: Medicare Other | Admitting: "Endocrinology

## 2018-09-08 VITALS — BP 111/70 | HR 59 | Ht 63.0 in | Wt 220.0 lb

## 2018-09-08 DIAGNOSIS — E782 Mixed hyperlipidemia: Secondary | ICD-10-CM | POA: Diagnosis not present

## 2018-09-08 DIAGNOSIS — E1165 Type 2 diabetes mellitus with hyperglycemia: Secondary | ICD-10-CM | POA: Diagnosis not present

## 2018-09-08 DIAGNOSIS — I10 Essential (primary) hypertension: Secondary | ICD-10-CM | POA: Diagnosis not present

## 2018-09-08 NOTE — Progress Notes (Signed)
Endocrinology follow-up note       09/08/2018, 1:00 PM   Subjective:    Patient ID: Jessica Weeks, female    DOB: [birth year].  Jessica Weeks is being seen in follow-up for management of currently uncontrolled symptomatic type 2 diabetes, hypertension, hyperlipidemia.  PMD:  Smith Robert, MD.   Past Medical History:  Diagnosis Date  . CAD (coronary artery disease)   . Diabetes mellitus, type II (HCC)   . Hyperlipidemia   . Hypertension    Past Surgical History:  Procedure Laterality Date  . EYE SURGERY     Social History   Socioeconomic History  . Marital status: Divorced    Spouse name: Not on file  . Number of children: Not on file  . Years of education: Not on file  . Highest education level: Not on file  Occupational History  . Not on file  Social Needs  . Financial resource strain: Not on file  . Food insecurity:    Worry: Not on file    Inability: Not on file  . Transportation needs:    Medical: Not on file    Non-medical: Not on file  Tobacco Use  . Smoking status: Never Smoker  . Smokeless tobacco: Never Used  Substance and Sexual Activity  . Alcohol use: Never    Frequency: Never  . Drug use: Never  . Sexual activity: Not on file  Lifestyle  . Physical activity:    Days per week: Not on file    Minutes per session: Not on file  . Stress: Not on file  Relationships  . Social connections:    Talks on phone: Not on file    Gets together: Not on file    Attends religious service: Not on file    Active member of club or organization: Not on file    Attends meetings of clubs or organizations: Not on file    Relationship status: Not on file  Other Topics Concern  . Not on file  Social History Narrative  . Not on file   Outpatient Encounter Medications as of 09/08/2018  Medication Sig  . amitriptyline (ELAVIL) 10 MG tablet at bedtime.  Marland Kitchen atorvastatin (LIPITOR) 20 MG  tablet daily.  . brimonidine (ALPHAGAN) 0.2 % ophthalmic solution 2 (two) times daily.  . carvedilol (COREG) 25 MG tablet 2 (two) times daily with a meal.   . cephALEXin (KEFLEX) 500 MG capsule Take 1 capsule (500 mg total) by mouth 4 (four) times daily.  . clopidogrel (PLAVIX) 75 MG tablet daily.  Marland Kitchen ezetimibe (ZETIA) 10 MG tablet daily.  . furosemide (LASIX) 40 MG tablet daily.  Marland Kitchen gabapentin (NEURONTIN) 100 MG capsule 200 mg 3 (three) times daily.  . GNP ASPIRIN LOW DOSE 81 MG EC tablet daily.  Marland Kitchen HUMALOG KWIKPEN 100 UNIT/ML KiwkPen Inject 10 Units into the skin 3 (three) times daily before meals.  . Insulin Glargine (LANTUS SOLOSTAR) 100 UNIT/ML Solostar Pen Inject 30 Units into the skin at bedtime.  Marland Kitchen NIFEdipine (PROCARDIA-XL/ADALAT CC) 30 MG 24 hr tablet 2 (two) times daily.   No facility-administered encounter medications on file as of 09/08/2018.  ALLERGIES: Allergies  Allergen Reactions  . Lisinopril Itching  . Novolog [Insulin Aspart] Hives    VACCINATION STATUS:  There is no immunization history on file for this patient.  Diabetes  She presents for her follow-up diabetic visit. She has type 2 diabetes mellitus. Onset time: She was diagnosed at approximate age of 6 years. Her disease course has been improving. Hypoglycemia symptoms include headaches. Pertinent negatives for hypoglycemia include no confusion, pallor or seizures. Associated symptoms include blurred vision, fatigue, polydipsia and polyuria. Pertinent negatives for diabetes include no chest pain and no polyphagia. There are no hypoglycemic complications. Symptoms are worsening. Diabetic complications include heart disease, nephropathy, peripheral neuropathy and retinopathy. Risk factors for coronary artery disease include dyslipidemia, family history, obesity, sedentary lifestyle, post-menopausal and hypertension. Current diabetic treatment includes insulin injections. Her weight is increasing steadily. She is  following a generally unhealthy diet. When asked about meal planning, she reported none. She has not had a previous visit with a dietitian. She never participates in exercise. Her home blood glucose trend is increasing steadily. Her breakfast blood glucose range is generally 140-180 mg/dl. Her lunch blood glucose range is generally 140-180 mg/dl. Her dinner blood glucose range is generally 140-180 mg/dl. Her bedtime blood glucose range is generally 140-180 mg/dl. Her overall blood glucose range is 140-180 mg/dl. (  ) An ACE inhibitor/angiotensin II receptor blocker is not being taken. Eye exam is current.  Hyperlipidemia  This is a chronic problem. The problem is uncontrolled. Exacerbating diseases include diabetes and obesity. Pertinent negatives include no chest pain, myalgias or shortness of breath. Current antihyperlipidemic treatment includes statins. Risk factors for coronary artery disease include dyslipidemia, diabetes mellitus, family history, hypertension, obesity, a sedentary lifestyle and post-menopausal.  Hypertension  This is a chronic problem. The current episode started more than 1 year ago. The problem is uncontrolled. Associated symptoms include blurred vision and headaches. Pertinent negatives include no chest pain, palpitations or shortness of breath. Risk factors for coronary artery disease include diabetes mellitus, dyslipidemia, obesity, sedentary lifestyle and post-menopausal state. Past treatments include calcium channel blockers and beta blockers. Hypertensive end-organ damage includes retinopathy.    Review of Systems  Constitutional: Positive for fatigue. Negative for chills, fever and unexpected weight change.  HENT: Negative for trouble swallowing and voice change.   Eyes: Positive for blurred vision. Negative for visual disturbance.  Respiratory: Negative for cough, shortness of breath and wheezing.   Cardiovascular: Negative for chest pain, palpitations and leg swelling.   Gastrointestinal: Negative for diarrhea, nausea and vomiting.  Endocrine: Positive for polydipsia and polyuria. Negative for cold intolerance, heat intolerance and polyphagia.  Musculoskeletal: Negative for arthralgias and myalgias.  Skin: Negative for color change, pallor, rash and wound.  Neurological: Positive for headaches. Negative for seizures.  Psychiatric/Behavioral: Negative for confusion and suicidal ideas.       Patient with mild to moderate cognitive deficit.    Objective:    BP 111/70   Pulse (!) 59   Ht 5\' 3"  (1.6 m)   Wt 220 lb (99.8 kg)   BMI 38.97 kg/m   Wt Readings from Last 3 Encounters:  09/08/18 220 lb (99.8 kg)  08/25/18 211 lb (95.7 kg)  08/07/18 202 lb (91.6 kg)     Physical Exam Constitutional:      Appearance: She is well-developed.  HENT:     Head: Normocephalic and atraumatic.  Neck:     Musculoskeletal: Normal range of motion and neck supple.     Thyroid: No  thyromegaly.     Trachea: No tracheal deviation.  Cardiovascular:     Rate and Rhythm: Normal rate.  Pulmonary:     Effort: Pulmonary effort is normal.  Abdominal:     Tenderness: There is no abdominal tenderness. There is no guarding.  Musculoskeletal: Normal range of motion.  Skin:    General: Skin is warm and dry.     Coloration: Skin is not pale.     Findings: No erythema or rash.  Neurological:     Mental Status: She is alert and oriented to person, place, and time.     Cranial Nerves: No cranial nerve deficit.     Coordination: Coordination normal.     Deep Tendon Reflexes: Reflexes are normal and symmetric.  Psychiatric:        Judgment: Judgment normal.     Comments: Patient with significant cognitive deficit.      Recent Results (from the past 2160 hour(s))  Basic metabolic panel     Status: Abnormal   Collection Time: 07/21/18 12:00 AM  Result Value Ref Range   BUN 42 (A) 4 - 21   Creatinine 1.5 (A) 0.5 - 1.1  Hemoglobin A1c     Status: None   Collection Time:  07/21/18 12:00 AM  Result Value Ref Range   Hemoglobin A1C 12.2   Urinalysis, Routine w reflex microscopic     Status: Abnormal   Collection Time: 08/07/18 12:41 PM  Result Value Ref Range   Color, Urine YELLOW YELLOW   APPearance HAZY (A) CLEAR   Specific Gravity, Urine 1.015 1.005 - 1.030   pH 5.0 5.0 - 8.0   Glucose, UA NEGATIVE NEGATIVE mg/dL   Hgb urine dipstick SMALL (A) NEGATIVE   Bilirubin Urine NEGATIVE NEGATIVE   Ketones, ur NEGATIVE NEGATIVE mg/dL   Protein, ur 630 (A) NEGATIVE mg/dL   Nitrite NEGATIVE NEGATIVE   Leukocytes, UA MODERATE (A) NEGATIVE   RBC / HPF 0-5 0 - 5 RBC/hpf   WBC, UA 21-50 0 - 5 WBC/hpf   Bacteria, UA MANY (A) NONE SEEN   Squamous Epithelial / LPF 0-5 0 - 5   Mucus PRESENT    Hyaline Casts, UA PRESENT     Comment: Performed at [facility], [address], [place], [place] [address]  Urine culture     Status: Abnormal   Collection Time: 08/07/18 12:41 PM  Result Value Ref Range   Specimen Description      URINE, RANDOM Performed at [facility], [address] [place], [place] [address]    Special Requests      NONE Performed at [facility], [address], [place], [place] [address]    Culture (A)     >=100,000 COLONIES/mL KLEBSIELLA PNEUMONIAE >=100,000 COLONIES/mL ESCHERICHIA COLI    Report Status 08/10/2018 FINAL    Organism ID, Bacteria KLEBSIELLA PNEUMONIAE (A)    Organism ID, Bacteria ESCHERICHIA COLI (A)       Susceptibility   Escherichia coli - MIC*    AMPICILLIN >=32 RESISTANT Resistant     CEFAZOLIN <=4 SENSITIVE Sensitive     CEFTRIAXONE <=1 SENSITIVE Sensitive     CIPROFLOXACIN <=0.25 SENSITIVE Sensitive     GENTAMICIN <=1 SENSITIVE Sensitive     IMIPENEM <=0.25 SENSITIVE Sensitive     NITROFURANTOIN 64 INTERMEDIATE Intermediate     TRIMETH/SULFA <=20 SENSITIVE Sensitive     AMPICILLIN/SULBACTAM >=32 RESISTANT Resistant     PIP/TAZO <=4 SENSITIVE Sensitive     Extended ESBL NEGATIVE Sensitive     * >=  100,000  COLONIES/mL ESCHERICHIA COLI   Klebsiella pneumoniae - MIC*    AMPICILLIN >=32 RESISTANT Resistant     CEFAZOLIN <=4 SENSITIVE Sensitive     CEFTRIAXONE <=1 SENSITIVE Sensitive     CIPROFLOXACIN <=0.25 SENSITIVE Sensitive     GENTAMICIN <=1 SENSITIVE Sensitive     IMIPENEM <=0.25 SENSITIVE Sensitive     NITROFURANTOIN <=16 SENSITIVE Sensitive     TRIMETH/SULFA <=20 SENSITIVE Sensitive     AMPICILLIN/SULBACTAM 4 SENSITIVE Sensitive     PIP/TAZO <=4 SENSITIVE Sensitive     Extended ESBL NEGATIVE Sensitive     * >=100,000 COLONIES/mL KLEBSIELLA PNEUMONIAE  Basic metabolic panel     Status: Abnormal   Collection Time: 08/07/18  1:10 PM  Result Value Ref Range   Sodium 139 135 - 145 mmol/L   Potassium 4.5 3.5 - 5.1 mmol/L   Chloride 108 98 - 111 mmol/L   CO2 24 22 - 32 mmol/L   Glucose, Bld 149 (H) 70 - 99 mg/dL   BUN 30 (H) 8 - 23 mg/dL   Creatinine, Ser 0.26 (H) 0.44 - 1.00 mg/dL   Calcium 9.1 8.9 - 37.8 mg/dL   GFR calc non Af Amer 50 (L) >60 mL/min   GFR calc Af Amer 58 (L) >60 mL/min   Anion gap 7 5 - 15    Comment: Performed at [facility], [address], [place], [place] [address]  Troponin I - Once     Status: None   Collection Time: 08/07/18  1:10 PM  Result Value Ref Range   Troponin I <0.03 <0.03 ng/mL    Comment: Performed at [facility], [address] [place], [place] [address]  CBC with Differential     Status: Abnormal   Collection Time: 08/07/18  1:10 PM  Result Value Ref Range   WBC 4.8 4.0 - 10.5 K/uL   RBC 3.54 (L) 3.87 - 5.11 MIL/uL   Hemoglobin 9.9 (L) 12.0 - 15.0 g/dL   HCT 28.7 (L) 86.7 - 67.2 %   MCV 88.7 80.0 - 100.0 fL   MCH 28.0 26.0 - 34.0 pg   MCHC 31.5 30.0 - 36.0 g/dL   RDW 09.4 70.9 - 62.8 %   Platelets 223 150 - 400 K/uL   nRBC 0.0 0.0 - 0.2 %   Neutrophils Relative % 47 %   Neutro Abs 2.3 1.7 - 7.7 K/uL   Lymphocytes Relative 35 %   Lymphs Abs 1.7 0.7 - 4.0 K/uL   Monocytes Relative 9 %   Monocytes Absolute 0.4 0.1 - 1.0 K/uL    Eosinophils Relative 8 %   Eosinophils Absolute 0.4 0.0 - 0.5 K/uL   Basophils Relative 1 %   Basophils Absolute 0.0 0.0 - 0.1 K/uL   Immature Granulocytes 0 %   Abs Immature Granulocytes 0.01 0.00 - 0.07 K/uL    Comment: Performed at [facility], [address], [place], [place] [address]   Lipid Panel     Component Value Date/Time   CHOL 212 (A) 09/19/2017   TRIG 77 09/19/2017   HDL 65 09/19/2017   LDLCALC 130 09/19/2017     Assessment & Plan:   1. Type 2 diabetes mellitus with stage 3 chronic kidney disease, coronary artery disease, retinopathy with long-term current use of insulin (HCC)  - Jessica Weeks has currently uncontrolled symptomatic type 2 DM since 73 years of age. -She returns with better engagement, still misses at least a third of her prandial insulin opportunities.  Her recent  A1c remains high at 12.2%, unchanged from her previous visit.  her diabetes is complicated by coronary artery disease, stage 3 renal insufficiency, retinopathy, peripheral neuropathy and Jessica Weeks remains at a high risk for more acute and chronic complications which include CAD, CVA, CKD, retinopathy, and neuropathy. These are all discussed in detail with the patient.  - I have counseled her on diet management and weight loss, by adopting a carbohydrate restricted/protein rich diet.  -  Suggestion is made for her to avoid simple carbohydrates  from her diet including Cakes, Sweet Desserts / Pastries, Ice Cream, Soda (diet and regular), Sweet Tea, Candies, Chips, Cookies, Store Bought Juices, Alcohol in Excess of  1-2 drinks a day, Artificial Sweeteners, and "Sugar-free" Products. This will help patient to have stable blood glucose profile and potentially avoid unintended weight gain.   - I encouraged her to switch to  unprocessed or minimally processed complex starch and increased protein intake (animal or plant source), fruits, and vegetables.  - she is advised to stick to a routine  mealtimes to eat 3 meals  a day and avoid unnecessary snacks ( to snack only to correct hypoglycemia).   - she has been scheduled with Norm SaltPenny Crumpton, RDN, CDE for individualized diabetes education.  - I have approached her with the following individualized plan to manage diabetes and patient agrees:   -She continues to struggle to follow recommended treatment regimen.   -  However, she reports that her daughter-in-law is helping her.  She is advised to continue Lantus 30 units daily at bedtime, continue Humalog  10 units 3 times a day with meals  for pre-meal BG readings of greater than or equal to 90mg /dl,  associated with strict monitoring of glucose 4 times a day-before meals and at bedtime.  -However, her compliance and cognitive ability is questionable .  Insulin dose adjustment will be performed slowly to avoid inadvertent hypoglycemia.  -There is very little chance that this patient will care for self, if her family is not able to help, she should be evaluated for group home or nursing home.  -For simplicity reasons, she will be considered for premixed insulin to use twice a day once she uses her current supplies of Lantus and Humalog.  -Patient is encouraged to call clinic for blood glucose levels less than 70 or above 300 mg /dl. -She would have benefited from low-dose metformin, however she does not tolerate metformin. -Patient is not a candidate for SGLT2 inhibitors due to CKD.  - she will be considered for incretin therapy as appropriate next visit. - Patient specific target  A1c;  LDL, HDL, Triglycerides, and  Waist Circumference were discussed in detail.  2) BP/HTN: Her blood pressure is better today at 160/77.     She is advised to continue her current blood pressure medications including nifedipine and carvedilol.      3) Lipids/HPL:   Her most recent lipid panel showed LDL of 130 consistent with uncontrolled profile.  She is advised to continue her atorvastatin 20 mg p.o.  nightly.    4)  Weight/Diet: CDE Consult is been initiated , exercise, and detailed carbohydrates information provided.  5) Chronic Care/Health Maintenance:  -she  Is on Statin medications and  is encouraged to continue to follow up with Ophthalmology, Dentist,  Podiatrist at least yearly or according to recommendations, and advised to  stay away from smoking. I have recommended yearly flu vaccine and pneumonia vaccination at least every 5 years; moderate  intensity exercise for up to 150 minutes weekly; and  sleep for at least 7 hours a day.  - I advised patient to maintain close follow up with Smith Robert, MD for primary care needs. - Time spent with the patient: 25 min, of which >50% was spent in reviewing her blood glucose logs , discussing her hypo- and hyper-glycemic episodes, reviewing her current and  previous labs and insulin doses and developing a plan to avoid hypo- and hyper-glycemia. Please refer to Patient Instructions for Blood Glucose Monitoring and Insulin/Medications Dosing Guide"  in media tab for additional information. Jessica Weeks participated in the discussions, expressed understanding, and voiced agreement with the above plans.  All questions were answered to her satisfaction. she is encouraged to contact clinic should she have any questions or concerns prior to her return visit.   Follow up plan: - Return in about 9 weeks (around 11/10/2018) for Follow up with Pre-visit Labs, Meter, and Logs.  Marquis Lunch, MD [facility] [address] [place], [place] [address] Phone: [phone]  Fax: [phone]    09/08/2018, 1:00 PM  This note was partially dictated with voice recognition software. Similar sounding words can be transcribed inadequately or may not  be corrected upon review.

## 2018-09-08 NOTE — Patient Instructions (Signed)

## 2018-11-12 ENCOUNTER — Encounter: Payer: Medicare Other | Admitting: "Endocrinology

## 2018-11-12 ENCOUNTER — Encounter: Payer: Self-pay | Admitting: "Endocrinology

## 2018-11-13 NOTE — Progress Notes (Signed)
This encounter was created in error - please disregard.

## 2019-05-20 ENCOUNTER — Other Ambulatory Visit: Payer: Self-pay | Admitting: Nephrology

## 2019-05-20 DIAGNOSIS — N183 Chronic kidney disease, stage 3 unspecified: Secondary | ICD-10-CM

## 2019-05-29 ENCOUNTER — Ambulatory Visit: Payer: Medicare Other

## 2019-06-03 ENCOUNTER — Ambulatory Visit: Payer: Medicare Other | Attending: Nephrology

## 2019-07-21 ENCOUNTER — Ambulatory Visit
Admission: RE | Admit: 2019-07-21 | Discharge: 2019-07-21 | Disposition: A | Discharge status: Home / Self Care | Payer: Medicare Other | Source: Ambulatory Visit | Attending: Nephrology | Admitting: Nephrology

## 2019-07-21 ENCOUNTER — Other Ambulatory Visit: Payer: Self-pay

## 2019-07-21 DIAGNOSIS — N183 Chronic kidney disease, stage 3 unspecified: Secondary | ICD-10-CM | POA: Diagnosis not present

## 2019-11-19 ENCOUNTER — Ambulatory Visit (HOSPITAL_COMMUNITY): Payer: Medicare Other | Admitting: Hematology

## 2019-12-08 ENCOUNTER — Encounter (HOSPITAL_COMMUNITY): Payer: Self-pay

## 2019-12-09 ENCOUNTER — Other Ambulatory Visit: Payer: Self-pay

## 2019-12-09 ENCOUNTER — Encounter (HOSPITAL_COMMUNITY): Payer: Self-pay | Admitting: Hematology

## 2019-12-09 ENCOUNTER — Inpatient Hospital Stay (HOSPITAL_COMMUNITY): Payer: Medicare Other

## 2019-12-09 ENCOUNTER — Inpatient Hospital Stay (HOSPITAL_COMMUNITY): Payer: Medicare Other | Attending: Nurse Practitioner | Admitting: Hematology

## 2019-12-09 VITALS — BP 188/92 | HR 61 | Temp 97.1°F | Resp 18 | Ht 65.5 in | Wt 185.7 lb

## 2019-12-09 DIAGNOSIS — Z800 Family history of malignant neoplasm of digestive organs: Secondary | ICD-10-CM | POA: Insufficient documentation

## 2019-12-09 DIAGNOSIS — Z809 Family history of malignant neoplasm, unspecified: Secondary | ICD-10-CM | POA: Insufficient documentation

## 2019-12-09 DIAGNOSIS — I129 Hypertensive chronic kidney disease with stage 1 through stage 4 chronic kidney disease, or unspecified chronic kidney disease: Secondary | ICD-10-CM | POA: Diagnosis not present

## 2019-12-09 DIAGNOSIS — E1122 Type 2 diabetes mellitus with diabetic chronic kidney disease: Secondary | ICD-10-CM | POA: Diagnosis not present

## 2019-12-09 DIAGNOSIS — Z794 Long term (current) use of insulin: Secondary | ICD-10-CM | POA: Insufficient documentation

## 2019-12-09 DIAGNOSIS — E1140 Type 2 diabetes mellitus with diabetic neuropathy, unspecified: Secondary | ICD-10-CM | POA: Diagnosis not present

## 2019-12-09 DIAGNOSIS — Z7982 Long term (current) use of aspirin: Secondary | ICD-10-CM | POA: Insufficient documentation

## 2019-12-09 DIAGNOSIS — Z7902 Long term (current) use of antithrombotics/antiplatelets: Secondary | ICD-10-CM | POA: Insufficient documentation

## 2019-12-09 DIAGNOSIS — D631 Anemia in chronic kidney disease: Secondary | ICD-10-CM | POA: Diagnosis not present

## 2019-12-09 DIAGNOSIS — D649 Anemia, unspecified: Secondary | ICD-10-CM | POA: Insufficient documentation

## 2019-12-09 DIAGNOSIS — Z801 Family history of malignant neoplasm of trachea, bronchus and lung: Secondary | ICD-10-CM | POA: Insufficient documentation

## 2019-12-09 DIAGNOSIS — N189 Chronic kidney disease, unspecified: Secondary | ICD-10-CM | POA: Insufficient documentation

## 2019-12-09 LAB — CBC WITH DIFFERENTIAL/PLATELET
Abs Immature Granulocytes: 0.01 10*3/uL (ref 0.00–0.07)
Basophils Absolute: 0 10*3/uL (ref 0.0–0.1)
Basophils Relative: 0 %
Eosinophils Absolute: 0.3 10*3/uL (ref 0.0–0.5)
Eosinophils Relative: 6 %
HCT: 32.5 % — ABNORMAL LOW (ref 36.0–46.0)
Hemoglobin: 9.8 g/dL — ABNORMAL LOW (ref 12.0–15.0)
Immature Granulocytes: 0 %
Lymphocytes Relative: 34 %
Lymphs Abs: 1.5 10*3/uL (ref 0.7–4.0)
MCH: 26.3 pg (ref 26.0–34.0)
MCHC: 30.2 g/dL (ref 30.0–36.0)
MCV: 87.1 fL (ref 80.0–100.0)
Monocytes Absolute: 0.3 10*3/uL (ref 0.1–1.0)
Monocytes Relative: 6 %
Neutro Abs: 2.4 10*3/uL (ref 1.7–7.7)
Neutrophils Relative %: 54 %
Platelets: 282 10*3/uL (ref 150–400)
RBC: 3.73 MIL/uL — ABNORMAL LOW (ref 3.87–5.11)
RDW: 13.2 % (ref 11.5–15.5)
WBC: 4.5 10*3/uL (ref 4.0–10.5)
nRBC: 0 % (ref 0.0–0.2)

## 2019-12-09 LAB — COMPREHENSIVE METABOLIC PANEL
ALT: 18 U/L (ref 0–44)
AST: 19 U/L (ref 15–41)
Albumin: 2.9 g/dL — ABNORMAL LOW (ref 3.5–5.0)
Alkaline Phosphatase: 79 U/L (ref 38–126)
Anion gap: 9 (ref 5–15)
BUN: 33 mg/dL — ABNORMAL HIGH (ref 8–23)
CO2: 27 mmol/L (ref 22–32)
Calcium: 8.8 mg/dL — ABNORMAL LOW (ref 8.9–10.3)
Chloride: 100 mmol/L (ref 98–111)
Creatinine, Ser: 1.9 mg/dL — ABNORMAL HIGH (ref 0.44–1.00)
GFR calc Af Amer: 30 mL/min — ABNORMAL LOW (ref >60)
GFR calc non Af Amer: 26 mL/min — ABNORMAL LOW (ref >60)
Glucose, Bld: 220 mg/dL — ABNORMAL HIGH (ref 70–99)
Potassium: 4.2 mmol/L (ref 3.5–5.1)
Sodium: 136 mmol/L (ref 135–145)
Total Bilirubin: 0.3 mg/dL (ref 0.3–1.2)
Total Protein: 7.5 g/dL (ref 6.5–8.1)

## 2019-12-09 LAB — DIRECT ANTIGLOBULIN TEST (NOT AT ARMC)
DAT, IgG: NEGATIVE
DAT, complement: NEGATIVE

## 2019-12-09 LAB — SAVE SMEAR(SSMR), FOR PROVIDER SLIDE REVIEW

## 2019-12-09 LAB — IRON AND TIBC
Iron: 30 ug/dL (ref 28–170)
Saturation Ratios: 11 % (ref 10.4–31.8)
TIBC: 273 ug/dL (ref 250–450)
UIBC: 243 ug/dL

## 2019-12-09 LAB — LACTATE DEHYDROGENASE: LDH: 214 U/L — ABNORMAL HIGH (ref 98–192)

## 2019-12-09 LAB — FOLATE: Folate: 11.1 ng/mL (ref >5.9)

## 2019-12-09 LAB — RETICULOCYTES
Immature Retic Fract: 10.8 % (ref 2.3–15.9)
RBC.: 3.75 MIL/uL — ABNORMAL LOW (ref 3.87–5.11)
Retic Count, Absolute: 23.6 10*3/uL (ref 19.0–186.0)
Retic Ct Pct: 0.6 % (ref 0.4–3.1)

## 2019-12-09 LAB — FERRITIN: Ferritin: 141 ng/mL (ref 11–307)

## 2019-12-09 LAB — VITAMIN D 25 HYDROXY (VIT D DEFICIENCY, FRACTURES): Vit D, 25-Hydroxy: 10.75 ng/mL — ABNORMAL LOW (ref 30–100)

## 2019-12-09 LAB — VITAMIN B12: Vitamin B-12: 850 pg/mL (ref 180–914)

## 2019-12-09 NOTE — Assessment & Plan Note (Signed)
1.  Normocytic anemia: -Patient seen at the request of Dr. Wynelle Link for management of normocytic anemia in the setting of CKD. -Labs from his office on 10/28/2019 shows hemoglobin 8.1 with hematocrit of 26 with MCV of 86.7.  White count and platelet count was normal.  Creatinine was 1.4. -She denies any bleeding per rectum or melena.  However she is on aspirin and Plavix for MI. -She reports decrease in energy.  She denies any fevers, night sweats or weight loss. -We will repeat her CBC today and check her ferritin, iron panel, B12, folic acid, methylmalonic acid and copper levels.  We will also check SPEP. -We will rule out hemolysis by checking LDH, reticulocyte count and direct Coombs test.  We will check for stool for occult blood. -If the iron is low, will benefit from parenteral iron therapy.  If there is no improvement with parenteral iron therapy, will also consider erythropoiesis stimulating agents.  2.  CKD: -Thought to be secondary to diabetic nephropathy.  Has nephrotic range proteinuria. -Renal ultrasound on 07/21/2019 shows diffuse increased echogenicity consistent with medical renal disease. -She follows up with Dr. Wynelle Link.  3.  Neuropathy: -She has no numbness in the hands and feet.  Thought to be secondary to diabetes.  She is taking gabapentin 2 tablets 3 times a day.  4.  Insulin-dependent diabetes: -She will continue the current regimen.  5.  Hypertension: -Continue carvedilol, amlodipine, Lasix, losartan.  Blood pressure today is elevated at 188/92.  She has not taken some of her medications.

## 2019-12-09 NOTE — Patient Instructions (Signed)
[facility] at [facility] Discharge Instructions  Follow up in 2-3 weeks   Thank you for choosing [facility] at [facility] to provide your oncology and hematology care.  To afford each patient quality time with our provider, please arrive at least 15 minutes before your scheduled appointment time.   If you have a lab appointment with the Cancer Center please come in thru the Main Entrance and check in at the main information desk.  You need to re-schedule your appointment should you arrive 10 or more minutes late.  We strive to give you quality time with our providers, and arriving late affects you and other patients whose appointments are after yours.  Also, if you no show three or more times for appointments you may be dismissed from the clinic at the providers discretion.     Again, thank you for choosing [facility].  Our hope is that these requests will decrease the amount of time that you wait before being seen by our physicians.       _____________________________________________________________  Should you have questions after your visit to [facility], please contact our office at [phone] between the hours of 8:00 a.m. and 4:30 p.m.  Voicemails left after 4:00 p.m. will not be returned until the following business day.  For prescription refill requests, have your pharmacy contact our office and allow 72 hours.    Due to Covid, you will need to wear a mask upon entering the hospital. If you do not have a mask, a mask will be given to you at the Main Entrance upon arrival. For doctor visits, patients may have 1 support person with them. For treatment visits, patients can not have anyone with them due to social distancing guidelines and our immunocompromised population.      

## 2019-12-09 NOTE — Progress Notes (Signed)
CONSULT NOTE  Patient Care Team: Smith Robert, MD as PCP - General (Family Medicine)  CHIEF COMPLAINTS/PURPOSE OF CONSULTATION: Anemia  HISTORY OF PRESENTING ILLNESS:  Jessica Weeks 74 y.o. female was sent here by his nephrologist for anemia in the setting of CKD.  Patient reports that she only recently learned that she had kidney disease and that she was anemic.  She reports she does feel fatigue from time to time.  She is on Plavix and aspirin daily.  She denies any easy bruising.  She has not noticed any other bleeding such as epistasis, hematuria or hematochezia.  She denies ever needing a blood transfusion.  She denies a history of blood clots.  She reports she is not on any oral iron supplementation.  She denies any pica and eats a variety diet.  She denies any alcohol, smoking or illicit drugs.  She denies any B symptoms such as fevers, chills, night sweats or unexplained weight loss.  She denies any recent chest pain on exertion, shortness of breath on minimal exertion, presyncopal episodes or palpitations.  She has no prior history or diagnosis of cancer.  Her age-appropriate screenings are up-to-date.  She denies a family history of any anemias or sickle cell disease.  She does have a family history of a father with throat cancer and a sister with colon and lung cancer.   MEDICAL HISTORY:  Past Medical History:  Diagnosis Date  . CAD (coronary artery disease)   . Diabetes mellitus, type II (HCC)   . Hyperlipidemia   . Hypertension     SURGICAL HISTORY: Past Surgical History:  Procedure Laterality Date  . BACK SURGERY    . EYE SURGERY      SOCIAL HISTORY: Social History   Socioeconomic History  . Marital status: Divorced    Spouse name: Not on file  . Number of children: 1  . Years of education: Not on file  . Highest education level: Not on file  Occupational History  . Occupation: Disabled  Tobacco Use  . Smoking status: Never Smoker  . Smokeless tobacco:  Never Used  Substance and Sexual Activity  . Alcohol use: Never  . Drug use: Never  . Sexual activity: Not Currently  Other Topics Concern  . Not on file  Social History Narrative  . Not on file   Social Determinants of Health   Financial Resource Strain:   . Difficulty of Paying Living Expenses:   Food Insecurity:   . Worried About Programme researcher, broadcasting/film/video in the Last Year:   . Barista in the Last Year:   Transportation Needs:   . Freight forwarder (Medical):   Marland Kitchen Lack of Transportation (Non-Medical):   Physical Activity:   . Days of Exercise per Week:   . Minutes of Exercise per Session:   Stress:   . Feeling of Stress :   Social Connections:   . Frequency of Communication with Friends and Family:   . Frequency of Social Gatherings with Friends and Family:   . Attends Religious Services:   . Active Member of Clubs or Organizations:   . Attends Banker Meetings:   Marland Kitchen Marital Status:   Intimate Partner Violence:   . Fear of Current or Ex-Partner:   . Emotionally Abused:   Marland Kitchen Physically Abused:   . Sexually Abused:     FAMILY HISTORY: Family History  Problem Relation Age of Onset  . Diabetes Mother   . Hypertension Mother   .  Hypertension Father   . Cancer Father   . Diabetes Sister   . Hypertension Sister   . Stroke Brother   . Diabetes Sister   . Hypertension Sister   . Congestive Heart Failure Sister   . Colon cancer Sister   . Lung cancer Sister   . Diabetes Sister     ALLERGIES:  is allergic to metformin; lisinopril; and novolog [insulin aspart].  MEDICATIONS:  Current Outpatient Medications  Medication Sig Dispense Refill  . amitriptyline (ELAVIL) 10 MG tablet at bedtime.  2  . atorvastatin (LIPITOR) 20 MG tablet daily.  0  . carvedilol (COREG) 25 MG tablet 2 (two) times daily with a meal.   6  . clopidogrel (PLAVIX) 75 MG tablet daily.  2  . dorzolamide-timolol (COSOPT) 22.3-6.8 MG/ML ophthalmic solution     . ezetimibe  (ZETIA) 10 MG tablet daily.  4  . gabapentin (NEURONTIN) 100 MG capsule 200 mg 3 (three) times daily.  0  . GNP ASPIRIN LOW DOSE 81 MG EC tablet daily.  2  . HUMALOG KWIKPEN 100 UNIT/ML KiwkPen Inject 10 Units into the skin 3 (three) times daily before meals.    . Insulin Glargine (LANTUS SOLOSTAR) 100 UNIT/ML Solostar Pen Inject 30 Units into the skin at bedtime.    Marland Kitchen losartan-hydrochlorothiazide (HYZAAR) 50-12.5 MG tablet     . NIFEdipine (PROCARDIA-XL/ADALAT CC) 30 MG 24 hr tablet 2 (two) times daily.  5  . methocarbamol (ROBAXIN) 750 MG tablet      No current facility-administered medications for this visit.    REVIEW OF SYSTEMS:   Constitutional: Denies fevers, chills or abnormal night sweats Respiratory: Denies cough, dyspnea or wheezes Cardiovascular: Denies palpitation, chest discomfort or lower extremity swelling Gastrointestinal:  Denies nausea, heartburn or change in bowel habits Skin: Denies abnormal skin rashes Lymphatics: Denies new lymphadenopathy or easy bruising Neurological: Positive for tingling and numbness in the feet. Behavioral/Psych: Mood is stable, no new changes  All other systems were reviewed with the patient and are negative.  PHYSICAL EXAMINATION: ECOG PERFORMANCE STATUS: 1 - Symptomatic but completely ambulatory  Vitals:   12/09/19 1317  BP: (!) 188/92  Pulse: 61  Resp: 18  Temp: (!) 97.1 F (36.2 C)  SpO2: 100%   Filed Weights   12/09/19 1317  Weight: 185 lb 11.2 oz (84.2 kg)    GENERAL:alert, no distress and comfortable SKIN: skin color, texture, turgor are normal, no rashes or significant lesions NECK: supple, thyroid normal size, non-tender, without nodularity LYMPH:  no palpable lymphadenopathy in the cervical, axillary or inguinal LUNGS: clear to auscultation and percussion with normal breathing effort HEART: regular rate & rhythm and no murmurs.  Trace edema bilaterally. ABDOMEN:abdomen soft, non-tender and normal bowel  sounds Musculoskeletal:no cyanosis of digits and no clubbing  PSYCH: alert & oriented x 3 with fluent speech NEURO: no focal motor/sensory deficits  LABORATORY DATA:  I have reviewed the data as listed Recent Results (from the past 2160 hour(s))  CBC with Differential/Platelet     Status: Abnormal   Collection Time: 12/09/19  2:53 PM  Result Value Ref Range   WBC 4.5 4.0 - 10.5 K/uL   RBC 3.73 (L) 3.87 - 5.11 MIL/uL   Hemoglobin 9.8 (L) 12.0 - 15.0 g/dL   HCT 32.3 (L) 55.7 - 32.2 %   MCV 87.1 80.0 - 100.0 fL   MCH 26.3 26.0 - 34.0 pg   MCHC 30.2 30.0 - 36.0 g/dL   RDW 02.5 42.7 - 06.2 %  Platelets 282 150 - 400 K/uL   nRBC 0.0 0.0 - 0.2 %   Neutrophils Relative % 54 %   Neutro Abs 2.4 1.7 - 7.7 K/uL   Lymphocytes Relative 34 %   Lymphs Abs 1.5 0.7 - 4.0 K/uL   Monocytes Relative 6 %   Monocytes Absolute 0.3 0.1 - 1.0 K/uL   Eosinophils Relative 6 %   Eosinophils Absolute 0.3 0.0 - 0.5 K/uL   Basophils Relative 0 %   Basophils Absolute 0.0 0.0 - 0.1 K/uL   Immature Granulocytes 0 %   Abs Immature Granulocytes 0.01 0.00 - 0.07 K/uL    Comment: Performed at [facility], [address] [place], [place] [address]  Comprehensive metabolic panel     Status: Abnormal   Collection Time: 12/09/19  2:53 PM  Result Value Ref Range   Sodium 136 135 - 145 mmol/L   Potassium 4.2 3.5 - 5.1 mmol/L   Chloride 100 98 - 111 mmol/L   CO2 27 22 - 32 mmol/L   Glucose, Bld 220 (H) 70 - 99 mg/dL    Comment: Glucose reference range applies only to samples taken after fasting for at least 8 hours.   BUN 33 (H) 8 - 23 mg/dL   Creatinine, Ser 5.28 (H) 0.44 - 1.00 mg/dL   Calcium 8.8 (L) 8.9 - 10.3 mg/dL   Total Protein 7.5 6.5 - 8.1 g/dL   Albumin 2.9 (L) 3.5 - 5.0 g/dL   AST 19 15 - 41 U/L   ALT 18 0 - 44 U/L   Alkaline Phosphatase 79 38 - 126 U/L   Total Bilirubin 0.3 0.3 - 1.2 mg/dL   GFR calc non Af Amer 26 (L) >60 mL/min   GFR calc Af Amer 30 (L) >60 mL/min   Anion gap 9 5 - 15     Comment: Performed at [facility], [address] [place], [place] [address]  Save [facility])     Status: None   Collection Time: 12/09/19  2:53 PM  Result Value Ref Range   Smear Review SMEAR STAINED AND AVAILABLE FOR REVIEW     Comment: Performed at [facility], [address], [place], [place] [address]  Lactate dehydrogenase     Status: Abnormal   Collection Time: 12/09/19  2:53 PM  Result Value Ref Range   LDH 214 (H) 98 - 192 U/L    Comment: Performed at [facility], [address] [place], [place] [address]  Ferritin     Status: None   Collection Time: 12/09/19  2:55 PM  Result Value Ref Range   Ferritin 141 11 - 307 ng/mL    Comment: Performed at [facility], [address] [place], [place] [address]  Iron and TIBC     Status: None   Collection Time: 12/09/19  2:55 PM  Result Value Ref Range   Iron 30 28 - 170 ug/dL   TIBC 474 259 - 563 ug/dL   Saturation Ratios 11 10.4 - 31.8 %   UIBC 243 ug/dL    Comment: Performed at [facility], [address], [place], [place] [address]  Direct antiglobulin test (not at [facility])     Status: None (Preliminary result)   Collection Time: 12/09/19  2:55 PM  Result Value Ref Range   DAT, complement PENDING    DAT, IgG      NEG Performed at [facility], [address] [place], [place] [address]   Vitamin B12  Status: None   Collection Time: 12/09/19  2:55 PM  Result Value Ref Range   Vitamin B-12 850 180 - 914 pg/mL    Comment: (NOTE) This assay is not validated for testing neonatal or myeloproliferative syndrome specimens for Vitamin B12 levels. Performed at [facility], [address], [place], [place] [address]   Reticulocytes     Status: Abnormal   Collection Time: 12/09/19  2:55 PM  Result Value Ref Range   Retic Ct Pct 0.6 0.4 - 3.1 %   RBC. 3.75 (L) 3.87 - 5.11 MIL/uL   Retic Count, Absolute 23.6 19.0 - 186.0 K/uL   Immature Retic Fract 10.8 2.3 - 15.9 %    Comment: Performed at [facility], [address],  [place], [place] [address]  Folate     Status: None   Collection Time: 12/09/19  2:56 PM  Result Value Ref Range   Folate 11.1 >5.9 ng/mL    Comment: Performed at [facility], [address] [place], [place] [address]    RADIOGRAPHIC STUDIES: I have personally reviewed the radiological images as listed and agreed with the findings in the report. I have independently interviewed and examined this patient.  I agree with the HPI written by my nurse practitioner Wenda Low, FNP.  I have independently formulated my assessment and plan.  ASSESSMENT & PLAN:  Normocytic anemia 1.  Normocytic anemia: -Patient seen at the request of Dr. Juleen China for management of normocytic anemia in the setting of CKD. -Labs from his office on 10/28/2019 shows hemoglobin 8.1 with hematocrit of 26 with MCV of 86.7.  White count and platelet count was normal.  Creatinine was 1.4. -She denies any bleeding per rectum or melena.  However she is on aspirin and Plavix for MI. -She reports decrease in energy.  She denies any fevers, night sweats or weight loss. -We will repeat her CBC today and check her ferritin, iron panel, T26, folic acid, methylmalonic acid and copper levels.  We will also check SPEP. -We will rule out hemolysis by checking LDH, reticulocyte count and direct Coombs test.  We will check for stool for occult blood. -If the iron is low, will benefit from parenteral iron therapy.  If there is no improvement with parenteral iron therapy, will also consider erythropoiesis stimulating agents.  2.  CKD: -Thought to be secondary to diabetic nephropathy.  Has nephrotic range proteinuria. -Renal ultrasound on 07/21/2019 shows diffuse increased echogenicity consistent with medical renal disease. -She follows up with Dr. Juleen China.  3.  Neuropathy: -She has no numbness in the hands and feet.  Thought to be secondary to diabetes.  She is taking gabapentin 2 tablets 3 times a day.  4.  Insulin-dependent diabetes: -She  will continue the current regimen.  5.  Hypertension: -Continue carvedilol, amlodipine, Lasix, losartan.  Blood pressure today is elevated at 188/92.  She has not taken some of her medications.     All questions were answered. The patient knows to call the clinic with any problems, questions or concerns.      Derek Jack, MD 12/09/19 5:18 PM

## 2019-12-10 LAB — HAPTOGLOBIN: Haptoglobin: 359 mg/dL — ABNORMAL HIGH (ref 42–346)

## 2019-12-11 LAB — PROTEIN ELECTROPHORESIS, SERUM
A/G Ratio: 0.7 (ref 0.7–1.7)
Albumin ELP: 2.8 g/dL — ABNORMAL LOW (ref 2.9–4.4)
Alpha-1-Globulin: 0.2 g/dL (ref 0.0–0.4)
Alpha-2-Globulin: 1.2 g/dL — ABNORMAL HIGH (ref 0.4–1.0)
Beta Globulin: 1.2 g/dL (ref 0.7–1.3)
Gamma Globulin: 1.4 g/dL (ref 0.4–1.8)
Globulin, Total: 3.9 g/dL (ref 2.2–3.9)
Total Protein ELP: 6.7 g/dL (ref 6.0–8.5)

## 2019-12-11 LAB — COPPER, SERUM: Copper: 181 ug/dL — ABNORMAL HIGH (ref 80–158)

## 2019-12-14 LAB — METHYLMALONIC ACID, SERUM: Methylmalonic Acid, Quantitative: 204 nmol/L (ref 0–378)

## 2019-12-23 ENCOUNTER — Other Ambulatory Visit: Payer: Self-pay

## 2019-12-23 ENCOUNTER — Inpatient Hospital Stay (HOSPITAL_COMMUNITY): Payer: Medicare Other | Attending: Nurse Practitioner | Admitting: Nurse Practitioner

## 2019-12-23 VITALS — BP 144/77 | HR 77 | Temp 97.1°F | Resp 18 | Wt 181.0 lb

## 2019-12-23 DIAGNOSIS — Z809 Family history of malignant neoplasm, unspecified: Secondary | ICD-10-CM | POA: Diagnosis not present

## 2019-12-23 DIAGNOSIS — E1140 Type 2 diabetes mellitus with diabetic neuropathy, unspecified: Secondary | ICD-10-CM | POA: Insufficient documentation

## 2019-12-23 DIAGNOSIS — Z800 Family history of malignant neoplasm of digestive organs: Secondary | ICD-10-CM | POA: Diagnosis not present

## 2019-12-23 DIAGNOSIS — D649 Anemia, unspecified: Secondary | ICD-10-CM

## 2019-12-23 DIAGNOSIS — I129 Hypertensive chronic kidney disease with stage 1 through stage 4 chronic kidney disease, or unspecified chronic kidney disease: Secondary | ICD-10-CM | POA: Diagnosis not present

## 2019-12-23 DIAGNOSIS — Z801 Family history of malignant neoplasm of trachea, bronchus and lung: Secondary | ICD-10-CM | POA: Insufficient documentation

## 2019-12-23 DIAGNOSIS — E1122 Type 2 diabetes mellitus with diabetic chronic kidney disease: Secondary | ICD-10-CM | POA: Diagnosis present

## 2019-12-23 DIAGNOSIS — E559 Vitamin D deficiency, unspecified: Secondary | ICD-10-CM | POA: Insufficient documentation

## 2019-12-23 DIAGNOSIS — I219 Acute myocardial infarction, unspecified: Secondary | ICD-10-CM | POA: Insufficient documentation

## 2019-12-23 DIAGNOSIS — D631 Anemia in chronic kidney disease: Secondary | ICD-10-CM | POA: Insufficient documentation

## 2019-12-23 DIAGNOSIS — N189 Chronic kidney disease, unspecified: Secondary | ICD-10-CM | POA: Diagnosis present

## 2019-12-23 DIAGNOSIS — Z7902 Long term (current) use of antithrombotics/antiplatelets: Secondary | ICD-10-CM | POA: Insufficient documentation

## 2019-12-23 MED ORDER — ERGOCALCIFEROL 1.25 MG (50000 UT) PO CAPS: 50000.0000 [IU] | ORAL_CAPSULE | ORAL | 3 refills | Status: AC

## 2019-12-23 NOTE — Patient Instructions (Signed)
[facility] Discharge Instructions  Follow up in 2 months with labs    Thank you for choosing [facility] to provide your oncology and hematology care.  To afford each patient quality time with our provider, please arrive at least 15 minutes before your scheduled appointment time.   If you have a lab appointment with the Cancer Center please come in thru the Main Entrance and check in at the main information desk.  You need to re-schedule your appointment should you arrive 10 or more minutes late.  We strive to give you quality time with our providers, and arriving late affects you and other patients whose appointments are after yours.  Also, if you no show three or more times for appointments you may be dismissed from the clinic at the providers discretion.     Again, thank you for choosing [facility].  Our hope is that these requests will decrease the amount of time that you wait before being seen by our physicians.       _____________________________________________________________  Should you have questions after your visit to  Cancer Center, please contact our office at [phone] between the hours of 8:00 a.m. and 4:30 p.m.  Voicemails left after 4:00 p.m. will not be returned until the following business day.  For prescription refill requests, have your pharmacy contact our office and allow 72 hours.    Due to Covid, you will need to wear a mask upon entering the hospital. If you do not have a mask, a mask will be given to you at the Main Entrance upon arrival. For doctor visits, patients may have 1 support person with them. For treatment visits, patients can not have anyone with them due to social distancing guidelines and our immunocompromised population.      

## 2019-12-23 NOTE — Progress Notes (Signed)
[facility] [address], [place] [address]   CLINIC:  Medical Oncology/Hematology  PCP:  Smith Robert, MD [address] [place] [address] [phone]   REASON FOR VISIT: Follow-up for normocytic anemia  CURRENT THERAPY: Intermittent iron infusions   INTERVAL HISTORY:  [name] 74 y.o. female returns for routine follow-up for normocytic anemia.  Patient reports she has done well since her last visit.  She denies any bright red bleeding per rectum or melena.  She denies any easy bruising or bleeding.  She reports she left her stool cards in her vehicle.  She will bring them next time she comes up. Denies any nausea, vomiting, or diarrhea. Denies any new pains. Had not noticed any recent bleeding such as epistaxis, hematuria or hematochezia. Denies recent chest pain on exertion, shortness of breath on minimal exertion, pre-syncopal episodes, or palpitations. Denies any numbness or tingling in hands or feet. Denies any recent fevers, infections, or recent hospitalizations. Patient reports appetite at 100% and energy level at 50%.  She is eating well maintain her weight at this time.    REVIEW OF SYSTEMS:  Review of Systems  Neurological: Positive for dizziness and numbness.  All other systems reviewed and are negative.    PAST MEDICAL/SURGICAL HISTORY:  Past Medical History:  Diagnosis Date  . CAD (coronary artery disease)   . Diabetes mellitus, type II (HCC)   . Hyperlipidemia   . Hypertension    Past Surgical History:  Procedure Laterality Date  . BACK SURGERY    . EYE SURGERY       SOCIAL HISTORY:  Social History   Socioeconomic History  . Marital status: Divorced    Spouse name: Not on file  . Number of children: 1  . Years of education: Not on file  . Highest education level: Not on file  Occupational History  . Occupation: Disabled  Tobacco Use  . Smoking status: Never Smoker  . Smokeless tobacco: Never Used  Substance and  Sexual Activity  . Alcohol use: Never  . Drug use: Never  . Sexual activity: Not Currently  Other Topics Concern  . Not on file  Social History Narrative  . Not on file   Social Determinants of Health   Financial Resource Strain:   . Difficulty of Paying Living Expenses:   Food Insecurity:   . Worried About Programme researcher, broadcasting/film/video in the Last Year:   . Barista in the Last Year:   Transportation Needs:   . Freight forwarder (Medical):   Marland Kitchen Lack of Transportation (Non-Medical):   Physical Activity:   . Days of Exercise per Week:   . Minutes of Exercise per Session:   Stress:   . Feeling of Stress :   Social Connections:   . Frequency of Communication with Friends and Family:   . Frequency of Social Gatherings with Friends and Family:   . Attends Religious Services:   . Active Member of Clubs or Organizations:   . Attends Banker Meetings:   Marland Kitchen Marital Status:   Intimate Partner Violence:   . Fear of Current or Ex-Partner:   . Emotionally Abused:   Marland Kitchen Physically Abused:   . Sexually Abused:     FAMILY HISTORY:  Family History  Problem Relation Age of Onset  . Diabetes Mother   . Hypertension Mother   . Hypertension Father   . Cancer Father   . Diabetes Sister   .  Hypertension Sister   . Stroke Brother   . Diabetes Sister   . Hypertension Sister   . Congestive Heart Failure Sister   . Colon cancer Sister   . Lung cancer Sister   . Diabetes Sister     CURRENT MEDICATIONS:  Outpatient Encounter Medications as of 12/23/2019  Medication Sig  . amitriptyline (ELAVIL) 10 MG tablet at bedtime.  Marland Kitchen atorvastatin (LIPITOR) 20 MG tablet daily.  . carvedilol (COREG) 25 MG tablet 2 (two) times daily with a meal.   . clopidogrel (PLAVIX) 75 MG tablet daily.  . dorzolamide-timolol (COSOPT) 22.3-6.8 MG/ML ophthalmic solution   . ezetimibe (ZETIA) 10 MG tablet daily.  Marland Kitchen gabapentin (NEURONTIN) 100 MG capsule 200 mg 3 (three) times daily.  . GNP ASPIRIN LOW  DOSE 81 MG EC tablet daily.  Marland Kitchen HUMALOG KWIKPEN 100 UNIT/ML KiwkPen Inject 10 Units into the skin 3 (three) times daily before meals.  . Insulin Glargine (LANTUS SOLOSTAR) 100 UNIT/ML Solostar Pen Inject 30 Units into the skin at bedtime.  Marland Kitchen losartan-hydrochlorothiazide (HYZAAR) 50-12.5 MG tablet Take 1 tablet by mouth daily.   Marland Kitchen NIFEdipine (PROCARDIA-XL/ADALAT CC) 30 MG 24 hr tablet 2 (two) times daily.  . methocarbamol (ROBAXIN) 750 MG tablet Take 750 mg by mouth every 8 (eight) hours as needed.    No facility-administered encounter medications on file as of 12/23/2019.    ALLERGIES:  Allergies  Allergen Reactions  . Metformin Nausea And Vomiting and Nausea Only  . Lisinopril Itching  . Novolog [Insulin Aspart] Hives     PHYSICAL EXAM:  ECOG Performance status: 1  Vitals:   12/23/19 1002  BP: (!) 144/77  Pulse: 77  Resp: 18  Temp: (!) 97.1 F (36.2 C)  SpO2: 100%   Filed Weights   12/23/19 1002  Weight: 181 lb (82.1 kg)   Physical Exam Constitutional:      Appearance: Normal appearance. She is normal weight.  Cardiovascular:     Rate and Rhythm: Normal rate and regular rhythm.     Heart sounds: Normal heart sounds.  Pulmonary:     Effort: Pulmonary effort is normal.     Breath sounds: Normal breath sounds.  Abdominal:     General: Bowel sounds are normal.     Palpations: Abdomen is soft.  Musculoskeletal:        General: Normal range of motion.  Skin:    General: Skin is warm.  Neurological:     Mental Status: She is alert and oriented to person, place, and time. Mental status is at baseline.  Psychiatric:        Mood and Affect: Mood normal.        Behavior: Behavior normal.        Thought Content: Thought content normal.        Judgment: Judgment normal.      LABORATORY DATA:  I have reviewed the labs as listed.  CBC    Component Value Date/Time   WBC 4.5 12/09/2019 1453   RBC 3.75 (L) 12/09/2019 1455   RBC 3.73 (L) 12/09/2019 1453   HGB 9.8 (L)  12/09/2019 1453   HCT 32.5 (L) 12/09/2019 1453   PLT 282 12/09/2019 1453   MCV 87.1 12/09/2019 1453   MCH 26.3 12/09/2019 1453   MCHC 30.2 12/09/2019 1453   RDW 13.2 12/09/2019 1453   LYMPHSABS 1.5 12/09/2019 1453   MONOABS 0.3 12/09/2019 1453   EOSABS 0.3 12/09/2019 1453   BASOSABS 0.0 12/09/2019 1453   CMP Latest  Ref Rng & Units 12/09/2019 08/07/2018 07/21/2018  Glucose 70 - 99 mg/dL 323(F) 573(U) -  BUN 8 - 23 mg/dL 20(U) 54(Y) 70(W)  Creatinine 0.44 - 1.00 mg/dL 2.37(S) 2.83(T) 5.1(V)  Sodium 135 - 145 mmol/L 136 139 -  Potassium 3.5 - 5.1 mmol/L 4.2 4.5 -  Chloride 98 - 111 mmol/L 100 108 -  CO2 22 - 32 mmol/L 27 24 -  Calcium 8.9 - 10.3 mg/dL 6.1(Y) 9.1 -  Total Protein 6.5 - 8.1 g/dL 7.5 - -  Total Bilirubin 0.3 - 1.2 mg/dL 0.3 - -  Alkaline Phos 38 - 126 U/L 79 - -  AST 15 - 41 U/L 19 - -  ALT 0 - 44 U/L 18 - -    All questions were answered to patient's stated satisfaction. Encouraged patient to call with any new concerns or questions before his next visit to the cancer center and we can certain see him sooner, if needed.     ASSESSMENT & PLAN:  Normocytic anemia 1.  Normocytic anemia: -Patient seen at the request of Dr. Wynelle Link for management of normocytic anemia in the setting of CKD. -Labs from his office on 10/28/2019 showed hemoglobin 8.1 with hematocrit 26 with MCV of 86.7.  White count and platelet were normal.  Creatinine was 1.4. -She denies any bleeding per rectum or melena.  However she is on aspirin and Plavix for MI. -She reports a decrease in energy.  She denies any fevers, night sweats, chills or unexplained weight loss. -Work-up labs done on 12/09/2019 showed SPEP negative.  Coombs test negative.  Reticulocyte count normal. -LDH was high at 214, copper was high at 181, vitamin D was low at 10.75.  Hemoglobin 9.8.  Ferritin was 141, percent saturation 11. -We will try to infusions of IV iron.  If there is no improvement with parenteral iron therapy we will  also consider erythropoiesis stimulating agents. -She will return in 2 months with repeat labs.  2.  CKD: -Thought to be secondary to diabetic nephropathy.  Has nephrotic range proteinuria. -Renal ultrasound on 07/21/2019 shows diffuse increased echogenicity consistent with medical renal disease. -She will follow up with Dr. Wynelle Link  3.  Vitamin D deficiency: -Labs done on 12/09/2019 showed vitamin D level 10.75 -We have called her in a prescription for 50,000 units weekly. -We will recheck her labs at her next visit.  4.  Neuropathy: -She has numbness in the hands and feet.  Thought to be secondary to diabetes. -She is taking gabapentin 2 tabs 3 times a day.  5.  Insulin-dependent diabetes: -She will continue her current regimen.  6.  Hypertension: -Continue carvedilol, amlodipine, Lasix and losartan. -Blood pressure today is 144/77.     Orders placed this encounter:  Orders Placed This Encounter  Procedures  . Lactate dehydrogenase  . CBC with Differential/Platelet  . Comprehensive metabolic panel  . Ferritin  . Iron and TIBC  . Vitamin B12  . VITAMIN D 25 Hydroxy (Vit-D Deficiency, Fractures)      Mathis Bud, FNP-C [facility] [phone]

## 2019-12-23 NOTE — Assessment & Plan Note (Addendum)
1.  Normocytic anemia: -Patient seen at the request of Dr. Wynelle Link for management of normocytic anemia in the setting of CKD. -Labs from his office on 10/28/2019 showed hemoglobin 8.1 with hematocrit 26 with MCV of 86.7.  White count and platelet were normal.  Creatinine was 1.4. -She denies any bleeding per rectum or melena.  However she is on aspirin and Plavix for MI. -She reports a decrease in energy.  She denies any fevers, night sweats, chills or unexplained weight loss. -Work-up labs done on 12/09/2019 showed SPEP negative.  Coombs test negative.  Reticulocyte count normal. -LDH was high at 214, copper was high at 181, vitamin D was low at 10.75.  Hemoglobin 9.8.  Ferritin was 141, percent saturation 11. -We will try to infusions of IV iron.  If there is no improvement with parenteral iron therapy we will also consider erythropoiesis stimulating agents. -She will return in 2 months with repeat labs.  2.  CKD: -Thought to be secondary to diabetic nephropathy.  Has nephrotic range proteinuria. -Renal ultrasound on 07/21/2019 shows diffuse increased echogenicity consistent with medical renal disease. -She will follow up with Dr. Wynelle Link  3.  Vitamin D deficiency: -Labs done on 12/09/2019 showed vitamin D level 10.75 -We have called her in a prescription for 50,000 units weekly. -We will recheck her labs at her next visit.  4.  Neuropathy: -She has numbness in the hands and feet.  Thought to be secondary to diabetes. -She is taking gabapentin 2 tabs 3 times a day.  5.  Insulin-dependent diabetes: -She will continue her current regimen.  6.  Hypertension: -Continue carvedilol, amlodipine, Lasix and losartan. -Blood pressure today is 144/77.

## 2020-01-01 ENCOUNTER — Ambulatory Visit (HOSPITAL_COMMUNITY): Payer: Medicare Other

## 2020-01-08 ENCOUNTER — Ambulatory Visit (HOSPITAL_COMMUNITY): Payer: Medicare Other

## 2020-01-13 ENCOUNTER — Encounter (HOSPITAL_COMMUNITY): Payer: Self-pay

## 2020-01-13 ENCOUNTER — Inpatient Hospital Stay (HOSPITAL_COMMUNITY): Payer: Medicare Other

## 2020-01-13 ENCOUNTER — Other Ambulatory Visit: Payer: Self-pay

## 2020-01-13 VITALS — BP 226/80 | HR 68 | Temp 97.3°F | Resp 18

## 2020-01-13 DIAGNOSIS — E1122 Type 2 diabetes mellitus with diabetic chronic kidney disease: Secondary | ICD-10-CM | POA: Diagnosis not present

## 2020-01-13 DIAGNOSIS — I10 Essential (primary) hypertension: Secondary | ICD-10-CM

## 2020-01-13 DIAGNOSIS — D649 Anemia, unspecified: Secondary | ICD-10-CM

## 2020-01-13 MED ORDER — CLONIDINE HCL 0.1 MG PO TABS
0.2000 mg | ORAL_TABLET | Freq: Once | ORAL | Status: AC
  Administered 2020-01-13: 0.2 mg via ORAL

## 2020-01-13 MED ORDER — SODIUM CHLORIDE 0.9 % IV SOLN
510.0000 mg | Freq: Once | INTRAVENOUS | Status: DC
  Filled 2020-01-13: qty 17

## 2020-01-13 MED ORDER — SODIUM CHLORIDE 0.9 % IV SOLN: Freq: Once | INTRAVENOUS | Status: AC

## 2020-01-13 MED ORDER — CLONIDINE HCL 0.1 MG PO TABS
ORAL_TABLET | ORAL | Status: AC
  Filled 2020-01-13: qty 2

## 2020-01-13 NOTE — Patient Instructions (Signed)

## 2020-01-13 NOTE — Progress Notes (Signed)
Patient did not take her blood pressure medication this morning.  Reviewed with pharmacy and Mathis Bud, NP, with verbal order clonidine 0.2mg  by mouth.  No s/s of distress noted and no complaints voiced by the patient.  Reviewed patients blood pressures with Mathis Bud, NP, after clonidine given with verbal order to reschedule the patient and instruct the patient to take her blood pressure medications as directed.  Patient stated understanding.    No s/s of distress noted.  Patient denied complaints.  Left by wheelchair.

## 2020-01-20 ENCOUNTER — Inpatient Hospital Stay (HOSPITAL_COMMUNITY): Payer: Medicare Other | Attending: Nurse Practitioner

## 2020-01-20 ENCOUNTER — Encounter (HOSPITAL_COMMUNITY): Payer: Self-pay

## 2020-01-20 VITALS — BP 209/83 | HR 70 | Temp 97.7°F | Resp 18

## 2020-01-20 DIAGNOSIS — N189 Chronic kidney disease, unspecified: Secondary | ICD-10-CM | POA: Diagnosis present

## 2020-01-20 DIAGNOSIS — D631 Anemia in chronic kidney disease: Secondary | ICD-10-CM | POA: Diagnosis present

## 2020-01-20 DIAGNOSIS — Z7902 Long term (current) use of antithrombotics/antiplatelets: Secondary | ICD-10-CM | POA: Diagnosis not present

## 2020-01-20 DIAGNOSIS — I219 Acute myocardial infarction, unspecified: Secondary | ICD-10-CM | POA: Insufficient documentation

## 2020-01-20 DIAGNOSIS — Z809 Family history of malignant neoplasm, unspecified: Secondary | ICD-10-CM | POA: Insufficient documentation

## 2020-01-20 DIAGNOSIS — E559 Vitamin D deficiency, unspecified: Secondary | ICD-10-CM | POA: Diagnosis not present

## 2020-01-20 DIAGNOSIS — Z800 Family history of malignant neoplasm of digestive organs: Secondary | ICD-10-CM | POA: Diagnosis not present

## 2020-01-20 DIAGNOSIS — Z801 Family history of malignant neoplasm of trachea, bronchus and lung: Secondary | ICD-10-CM | POA: Insufficient documentation

## 2020-01-20 DIAGNOSIS — D649 Anemia, unspecified: Secondary | ICD-10-CM

## 2020-01-20 DIAGNOSIS — I129 Hypertensive chronic kidney disease with stage 1 through stage 4 chronic kidney disease, or unspecified chronic kidney disease: Secondary | ICD-10-CM | POA: Insufficient documentation

## 2020-01-20 DIAGNOSIS — E1122 Type 2 diabetes mellitus with diabetic chronic kidney disease: Secondary | ICD-10-CM | POA: Insufficient documentation

## 2020-01-20 DIAGNOSIS — E1140 Type 2 diabetes mellitus with diabetic neuropathy, unspecified: Secondary | ICD-10-CM | POA: Insufficient documentation

## 2020-01-20 MED ORDER — SODIUM CHLORIDE 0.9 % IV SOLN: Freq: Once | INTRAVENOUS | Status: AC

## 2020-01-20 MED ORDER — SODIUM CHLORIDE 0.9 % IV SOLN
510.0000 mg | Freq: Once | INTRAVENOUS | Status: AC
  Administered 2020-01-20: 510 mg via INTRAVENOUS
  Filled 2020-01-20: qty 510

## 2020-01-20 NOTE — Progress Notes (Signed)
Reviewed the patients blood pressures with Mathis Bud, NP, for pre and post iron.  Ok to discharge the patient verbal order Mathis Bud, NP.  The patient stated she takes her blood pressure medications and has an appointment tomorrow with her PCP.    Patient tolerated iron infusion with no complaints voiced.  Peripheral IV site clean and dry with good blood return noted before and after infusion.  Band aid applied.  VSS with discharge and left by wheelchair with no s/s of distress noted.

## 2020-01-29 ENCOUNTER — Ambulatory Visit (HOSPITAL_COMMUNITY): Payer: Medicare Other

## 2020-02-18 DEATH — deceased

## 2020-03-02 ENCOUNTER — Inpatient Hospital Stay (HOSPITAL_COMMUNITY): Payer: Medicare Other | Attending: Hematology

## 2020-03-09 ENCOUNTER — Ambulatory Visit (HOSPITAL_COMMUNITY): Payer: Medicare Other | Admitting: Nurse Practitioner

## 2020-03-25 IMAGING — CT CT HEAD W/O CM
3 series · 16 of 47 positions shown, 19 images · non-contrast
Comparison: None.

CLINICAL DATA: Hypertension with dizziness

EXAM:
CT HEAD WITHOUT CONTRAST
TECHNIQUE: Contiguous axial images were obtained from the base of the skull
through the vertex without intravenous contrast.

[Series 2: head wo · axial · 0.46mm/px · z∈[+138,+273]mm · 10 of 33 slices shown, 13 images]
[im 3/33  brain]
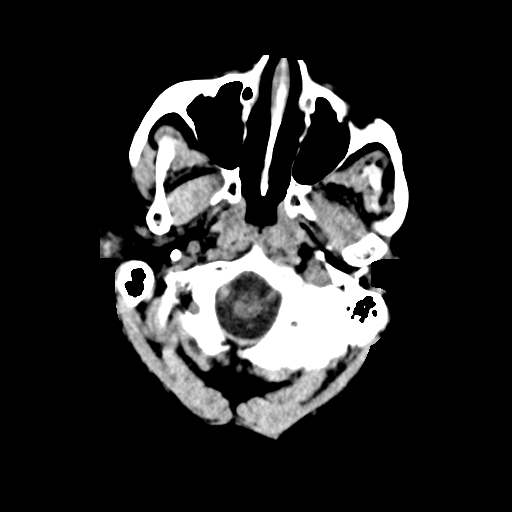
[im 3/33  bone]
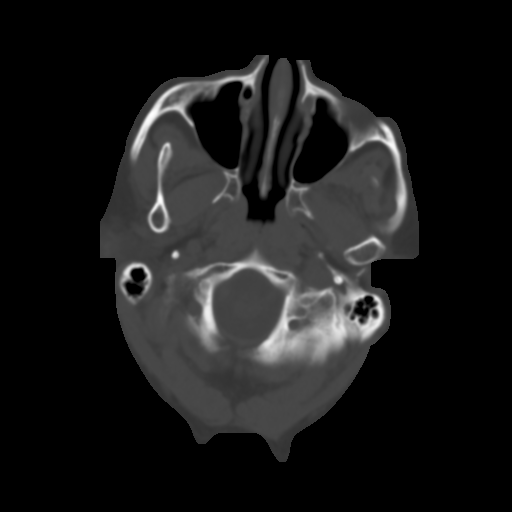
[im 6/33  brain]
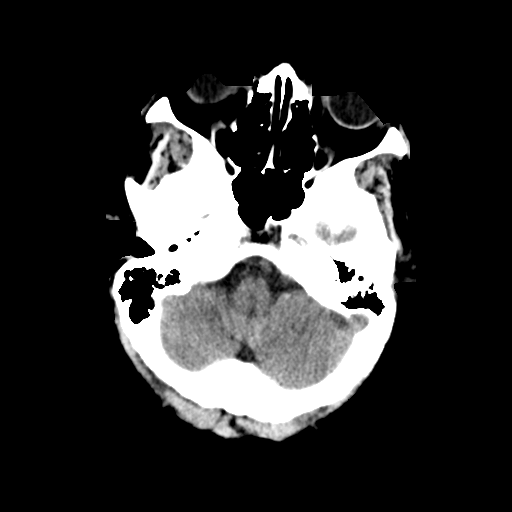
[im 9/33  brain]
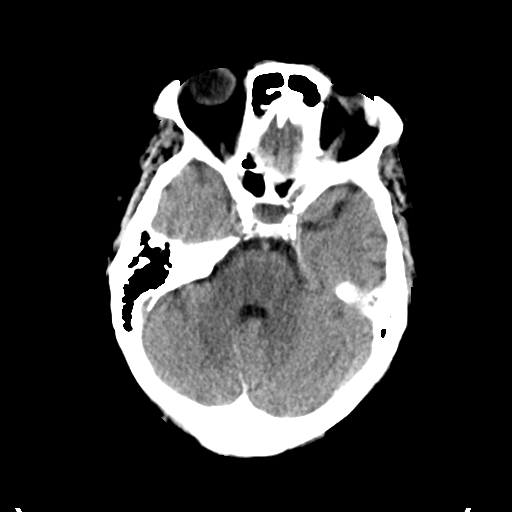
[im 12/33  brain]
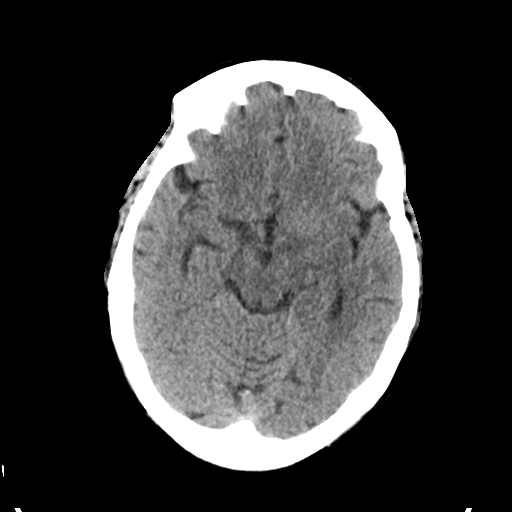
[im 15/33  brain]
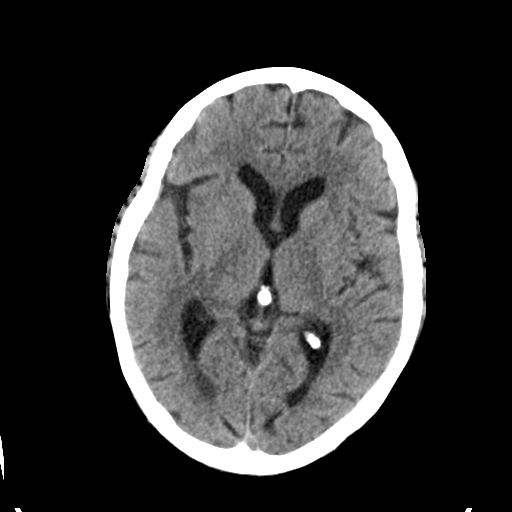
[im 15/33  bone]
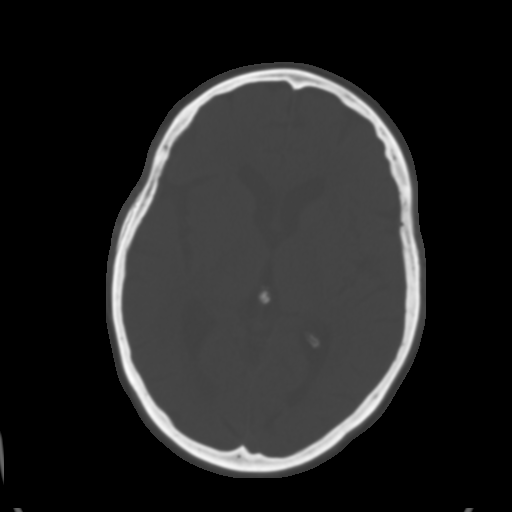
[im 18/33  brain]
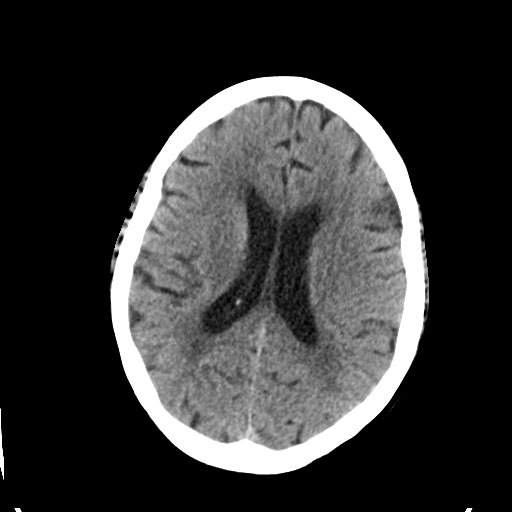
[im 21/33  brain]
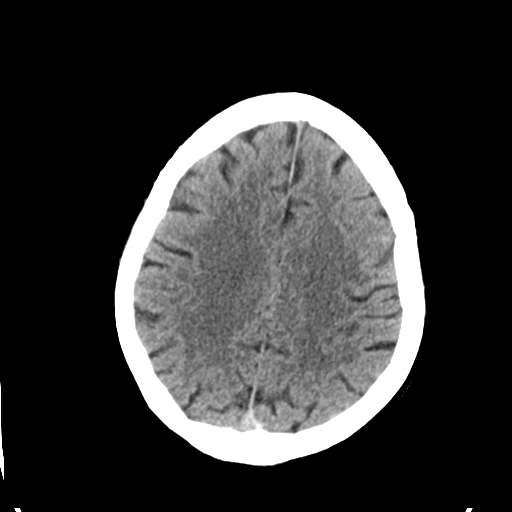
[im 25/33  brain]
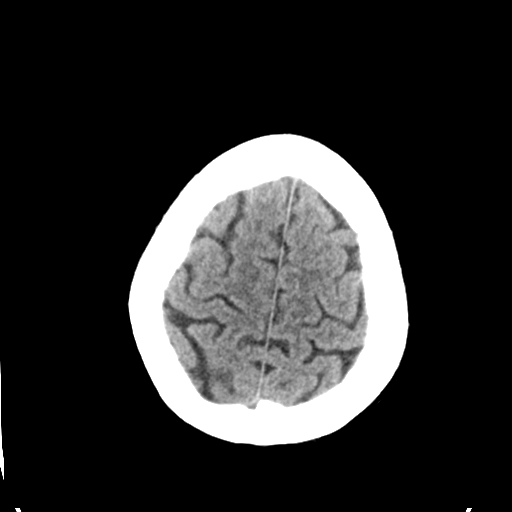
[im 27/33  brain]
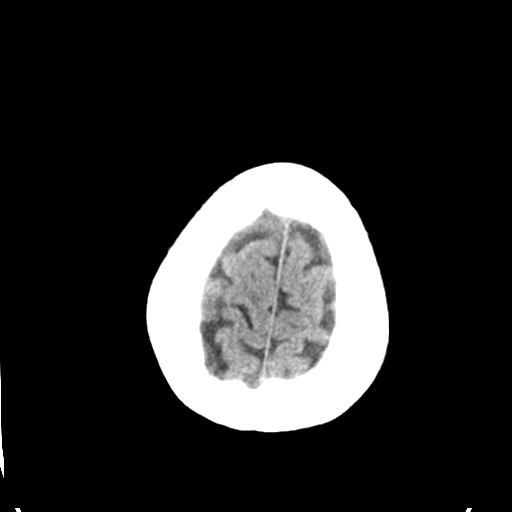
[im 27/33  bone]
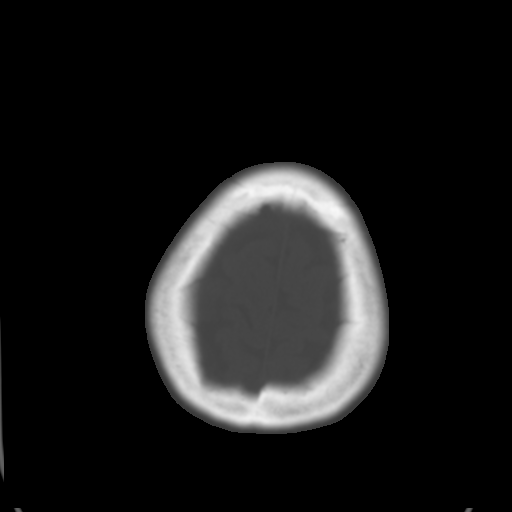
[im 30/33  brain]
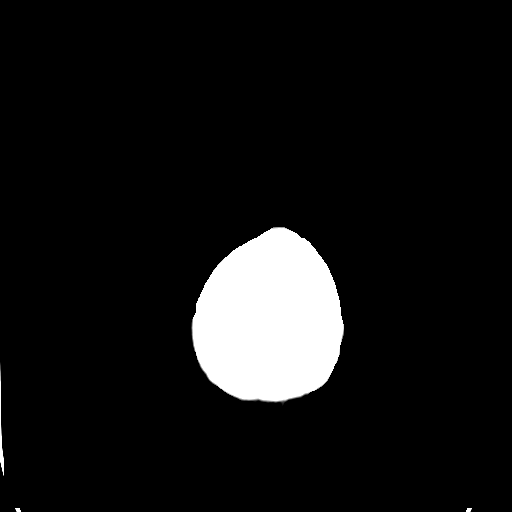

[Series 4: coronal soft tissue · coronal · 0.36mm/px · 3 of 73 slices shown]
[im 25/73  brain]
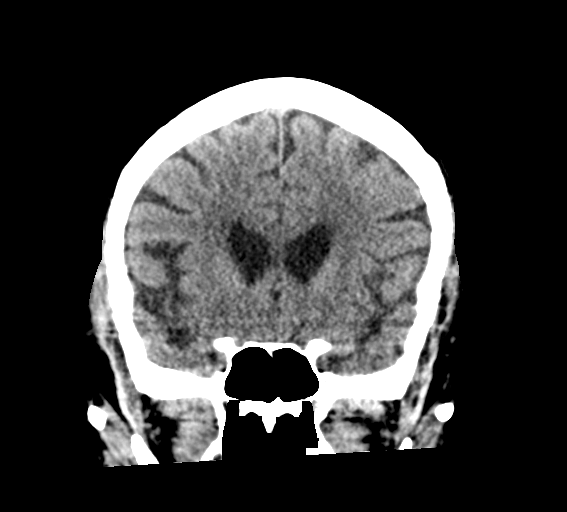
[im 33/73  brain]
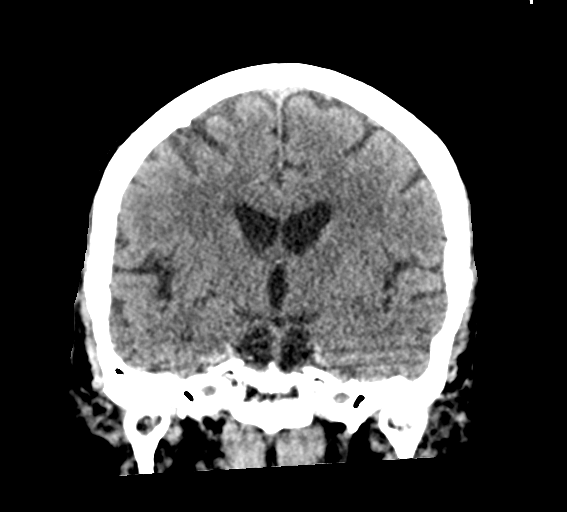
[im 41/73  brain]
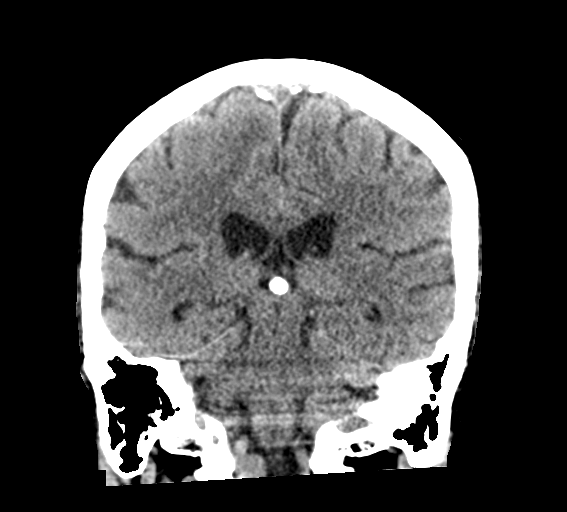

[Series 5: sagittal soft tissue · sagittal · 0.37mm/px · 3 of 67 slices shown]
[im 23/67  brain]
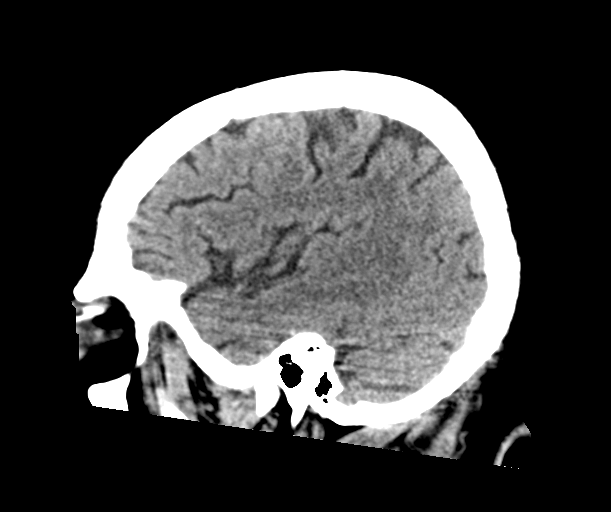
[im 34/67  brain]
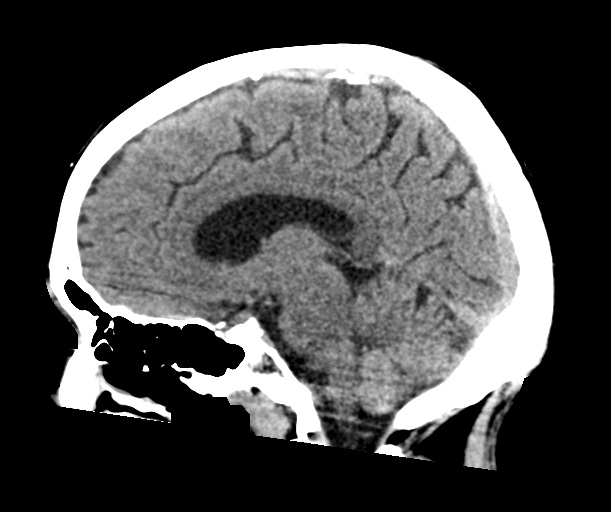
[im 45/67  brain]
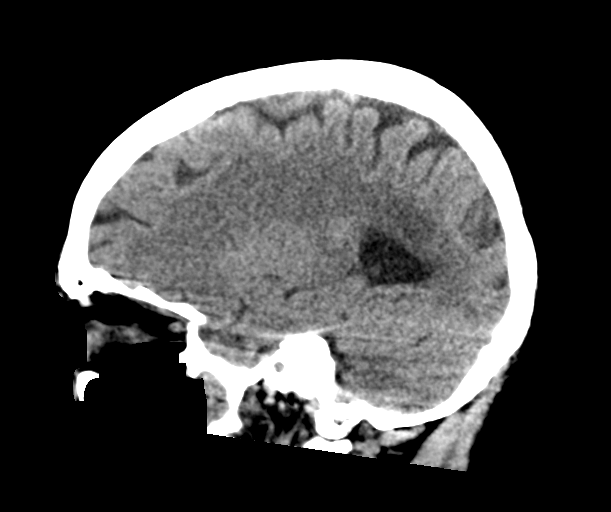

[16 of 47 positions shown; findings below may reference images not displayed]

FINDINGS: Brain: There is age related volume loss. There is no intracranial
mass, hemorrhage, extra-axial fluid collection, or midline shift.
There is mild patchy small vessel disease in the centra semiovale
bilaterally. No acute infarct is demonstrable on this study.

Vascular: There is no hyperdense vessel. There is calcification in
each carotid siphon region.

Skull: The bony calvarium appears intact.

Sinuses/Orbits: There is mucosal thickening in several ethmoid air
cells. Other visualized paranasal sinuses are clear. Orbits appear
symmetric bilaterally. Patient has had cataract removals
bilaterally.

Other: Mastoid air cells are clear.
IMPRESSION: Mild patchy periventricular small vessel disease. No acute infarct
evident. No mass or hemorrhage.

There are foci of arterial vascular calcification. There is mucosal
thickening in several ethmoid air cells.

## 2024-04-02 ENCOUNTER — Other Ambulatory Visit: Payer: Self-pay | Admitting: *Deleted
